# Patient Record
Sex: Male | Born: 1950 | Race: White | Hispanic: No | Marital: Single | Smoking: Current every day smoker
Health system: Southern US, Community
[De-identification: ages and names within clinical notes are randomized; demographics above are authoritative.]

## PROBLEM LIST (undated history)

## (undated) DIAGNOSIS — IMO0002 Reserved for concepts with insufficient information to code with codable children: Secondary | ICD-10-CM

## (undated) DIAGNOSIS — I10 Essential (primary) hypertension: Secondary | ICD-10-CM

## (undated) DIAGNOSIS — F1010 Alcohol abuse, uncomplicated: Secondary | ICD-10-CM

## (undated) DIAGNOSIS — H548 Legal blindness, as defined in USA: Secondary | ICD-10-CM

---

## 2009-08-12 ENCOUNTER — Emergency Department (HOSPITAL_COMMUNITY): Admission: EM | Admit: 2009-08-12 | Discharge: 2009-08-12 | Payer: Self-pay | Admitting: Emergency Medicine

## 2009-08-22 ENCOUNTER — Encounter: Payer: Self-pay | Admitting: Internal Medicine

## 2009-08-22 ENCOUNTER — Ambulatory Visit: Payer: Self-pay | Admitting: Internal Medicine

## 2009-08-22 ENCOUNTER — Inpatient Hospital Stay (HOSPITAL_COMMUNITY): Admission: EM | Admit: 2009-08-22 | Discharge: 2009-08-23 | Payer: Self-pay | Admitting: Emergency Medicine

## 2009-08-24 ENCOUNTER — Telehealth (INDEPENDENT_AMBULATORY_CARE_PROVIDER_SITE_OTHER): Payer: Self-pay | Admitting: *Deleted

## 2009-08-25 ENCOUNTER — Telehealth (INDEPENDENT_AMBULATORY_CARE_PROVIDER_SITE_OTHER): Payer: Self-pay

## 2009-08-29 ENCOUNTER — Telehealth (INDEPENDENT_AMBULATORY_CARE_PROVIDER_SITE_OTHER): Payer: Self-pay | Admitting: *Deleted

## 2009-09-06 ENCOUNTER — Telehealth (INDEPENDENT_AMBULATORY_CARE_PROVIDER_SITE_OTHER): Payer: Self-pay | Admitting: *Deleted

## 2009-09-07 ENCOUNTER — Telehealth (INDEPENDENT_AMBULATORY_CARE_PROVIDER_SITE_OTHER): Payer: Self-pay

## 2009-09-09 ENCOUNTER — Encounter (INDEPENDENT_AMBULATORY_CARE_PROVIDER_SITE_OTHER): Payer: Self-pay | Admitting: *Deleted

## 2010-05-30 NOTE — Consult Note (Signed)
Summary: Consultation Report  Consultation Report   Imported By: Debby Freiberg 10/26/2009 12:12:00  _____________________________________________________________________  External Attachment:    Type:   Image     Comment:   External Document

## 2010-05-30 NOTE — Progress Notes (Signed)
Summary: Nuclear Pre-procedure  Phone Note Outgoing Call Call back at Home Phone [phone]   Call placed by: Stanton Kidney, EMT-P,  Sep 06, 2009 2:35 PM Call placed to: Patient Action Taken: Phone Call Completed Summary of Call: Reviewed information on Myoview Information Sheet (see scanned document for further details).  Spoke with Patient.    Nuclear Med Background Indications for Stress Test: Evaluation for Ischemia, Post Hospital  Indications Comments: 08/22/09 [facility] - CP  History: Echo, Heart Catheterization, Myocardial Infarction, Stents  History Comments: 02/11[place]Heart [place] ([place]) 08/22/09 ECHO EF 55-60%  Symptoms: Chest Pain    Nuclear Pre-Procedure Cardiac Risk Factors: Family History - CAD, Hypertension, Smoker  Nuclear Med Study Referring MD:  [employer]

## 2010-05-30 NOTE — Progress Notes (Signed)
Summary: Nuclear Pre-Procedure  Phone Note Outgoing Call   Call placed by: Milana Na, EMT-P,  August 24, 2009 1:00 PM Summary of Call: Reviewed information on Myoview Information Sheet (see scanned document for further details).  Spoke with patient.      Nuclear Med Background Indications for Stress Test: Evaluation for Ischemia, Post Hospital  Indications Comments: 08/22/09 [facility] - CP  History: Echo, Heart Catheterization, Myocardial Infarction, Stents   Symptoms: Chest Pain    Nuclear Pre-Procedure Cardiac Risk Factors: Family History - CAD, Hypertension, Smoker  Nuclear Med Study Referring MD:  [employer]

## 2010-05-30 NOTE — Progress Notes (Signed)
Summary: Myoview Cancellation  Phone Note Outgoing Call Call back at Home Phone [phone]   Call placed by: Irean Hong, RN,  August 25, 2009 8:53 AM Summary of Call: The patient was a no show for myoview today due to transportation. Rescheduled the lexiscan myoview for 08/30/09 at 11:45. The patient told it would be a $100.00 charge if no show for this appointment. Louine Tenpenny,RN.

## 2010-05-30 NOTE — Progress Notes (Signed)
Summary: Nuclear Pre-Procedure  Phone Note Outgoing Call   Call placed by: Milana Na, EMT-P,  Aug 29, 2009 4:10 PM Summary of Call: Reviewed information on Myoview Information Sheet (see scanned document for further details).  No Answer.     Nuclear Med Background Indications for Stress Test: Evaluation for Ischemia, Post Hospital  Indications Comments: 08/22/09 [facility] - CP  History: Echo, Heart Catheterization, Myocardial Infarction, Stents  History Comments: 02/11[place] 08/22/09 ECHO EF 55-60%  Symptoms: Chest Pain    Nuclear Pre-Procedure Cardiac Risk Factors: Family History - CAD, Hypertension, Smoker  Nuclear Med Study Referring MD:  [employer]

## 2010-05-30 NOTE — Letter (Signed)
Summary: Appointment - Missed  [facility], Main Office  [address]   [place], [place] [address]   Phone: [phone]  Fax: [phone]     Sep 09, 2009 MRN: [id number]   [facility] [address] [place], [place]  [address]   Dear Mr. Rando,  Our records indicate you missed your appointment on 09/08/2009 with Dr. Tenny Craw. It is very important that we reach you to reschedule this appointment. We look forward to participating in your health care needs. Please contact us at the number listed above at your earliest convenience to reschedule this appointment.     Sincerely,   Migdalia Dk [facility] Scheduling Team

## 2010-05-30 NOTE — Progress Notes (Signed)
Summary: No Show for [facility]  Phone Note Outgoing Call Call back at [facility] Phone [phone]   Call placed by: Irean Hong, RN,  Sep 07, 2009 1:19 PM Summary of Call: The patient was a no show for myoview today, left message for the patient to call us back. This was the third no show for this test. The patient will need to have office visit prior to test. Dylan Jacobson.

## 2010-07-18 LAB — COMPREHENSIVE METABOLIC PANEL
ALT: 15 U/L (ref 0–53)
AST: 27 U/L (ref 0–37)
Albumin: 3.4 g/dL — ABNORMAL LOW (ref 3.5–5.2)
Alkaline Phosphatase: 49 U/L (ref 39–117)
BUN: 10 mg/dL (ref 6–23)
Calcium: 8.7 mg/dL (ref 8.4–10.5)
Chloride: 106 mEq/L (ref 96–112)
GFR calc non Af Amer: 59 mL/min — ABNORMAL LOW (ref >60)
Potassium: 4 mEq/L (ref 3.5–5.1)
Sodium: 138 mEq/L (ref 135–145)
Total Protein: 6.2 g/dL (ref 6.0–8.3)

## 2010-07-18 LAB — LIPID PANEL
Cholesterol: 171 mg/dL (ref 0–200)
LDL Cholesterol: 127 mg/dL — ABNORMAL HIGH (ref 0–99)
Total CHOL/HDL Ratio: 7.4 RATIO
Triglycerides: 107 mg/dL (ref <150)
VLDL: 21 mg/dL (ref 0–40)

## 2010-07-18 LAB — GLUCOSE, CAPILLARY: Glucose-Capillary: 88 mg/dL (ref 70–99)

## 2010-07-18 LAB — BASIC METABOLIC PANEL
GFR calc Af Amer: >60 mL/min (ref >60)
GFR calc non Af Amer: 59 mL/min — ABNORMAL LOW (ref >60)

## 2010-07-18 LAB — HEMOGLOBIN A1C
Hgb A1c MFr Bld: 5.4 % (ref <5.7)
Mean Plasma Glucose: 108 mg/dL (ref <117)

## 2010-07-18 LAB — CBC
HCT: 39.8 % (ref 39.0–52.0)
Hemoglobin: 13.3 g/dL (ref 13.0–17.0)
MCV: 92.2 fL (ref 78.0–100.0)
Platelets: 191 10*3/uL (ref 150–400)
RBC: 4.21 MIL/uL — ABNORMAL LOW (ref 4.22–5.81)
RDW: 13.4 % (ref 11.5–15.5)
WBC: 5.1 10*3/uL (ref 4.0–10.5)

## 2010-07-18 LAB — POCT CARDIAC MARKERS
CKMB, poc: 1.4 ng/mL (ref 1.0–8.0)
Troponin i, poc: <0.05 ng/mL (ref 0.00–0.09)

## 2010-07-18 LAB — POCT I-STAT, CHEM 8
Chloride: 103 mEq/L (ref 96–112)
Potassium: 4 mEq/L (ref 3.5–5.1)
Sodium: 141 mEq/L (ref 135–145)
TCO2: 28 mmol/L (ref 0–100)

## 2010-07-18 LAB — CARDIAC PANEL(CRET KIN+CKTOT+MB+TROPI): CK, MB: 1.9 ng/mL (ref 0.3–4.0)

## 2010-10-21 ENCOUNTER — Emergency Department: Payer: Self-pay | Admitting: Emergency Medicine

## 2011-01-20 IMAGING — CR DG CHEST 1V PORT
1 series · 1 of 1 positions shown · non-contrast
Comparison: 08/12/2009

CLINICAL DATA: Right chest pain

PORTABLE CHEST - 1 VIEW

[AP]
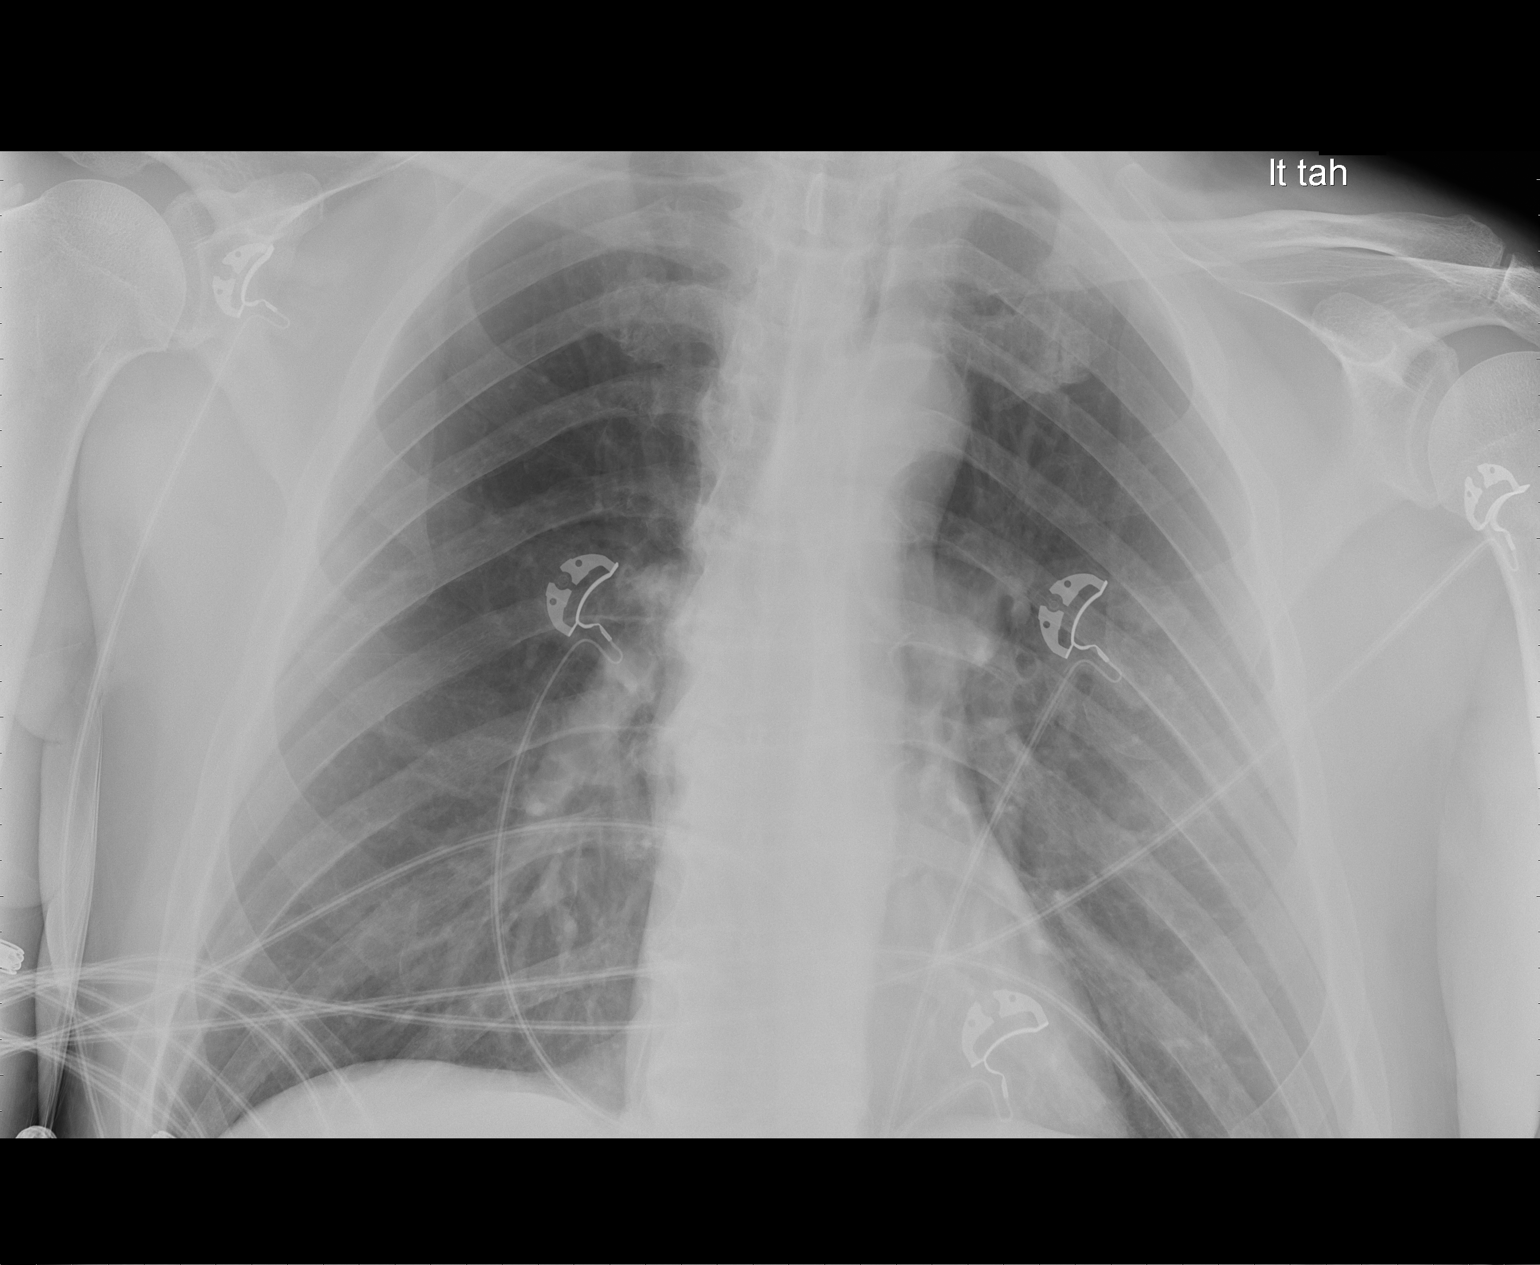

[1 of 1 positions shown; findings below may reference images not displayed]

FINDINGS: Heart and mediastinal contours normal.  Lungs clear.
Osseous structures intact in one-view.

Post cervical fusion.
IMPRESSION: No active disease in one-view.

## 2012-03-20 IMAGING — CR DG CHEST 1V PORT
1 series · 1 of 1 positions shown · non-contrast
Comparison: none

REASON FOR EXAM: cp
COMMENTS:

PROCEDURE:     DXR - DXR PORTABLE CHEST SINGLE VIEW  - October 21, 2010  [DATE]
RESULT:     No acute cardiopulmonary disease noted.

[view not recorded]
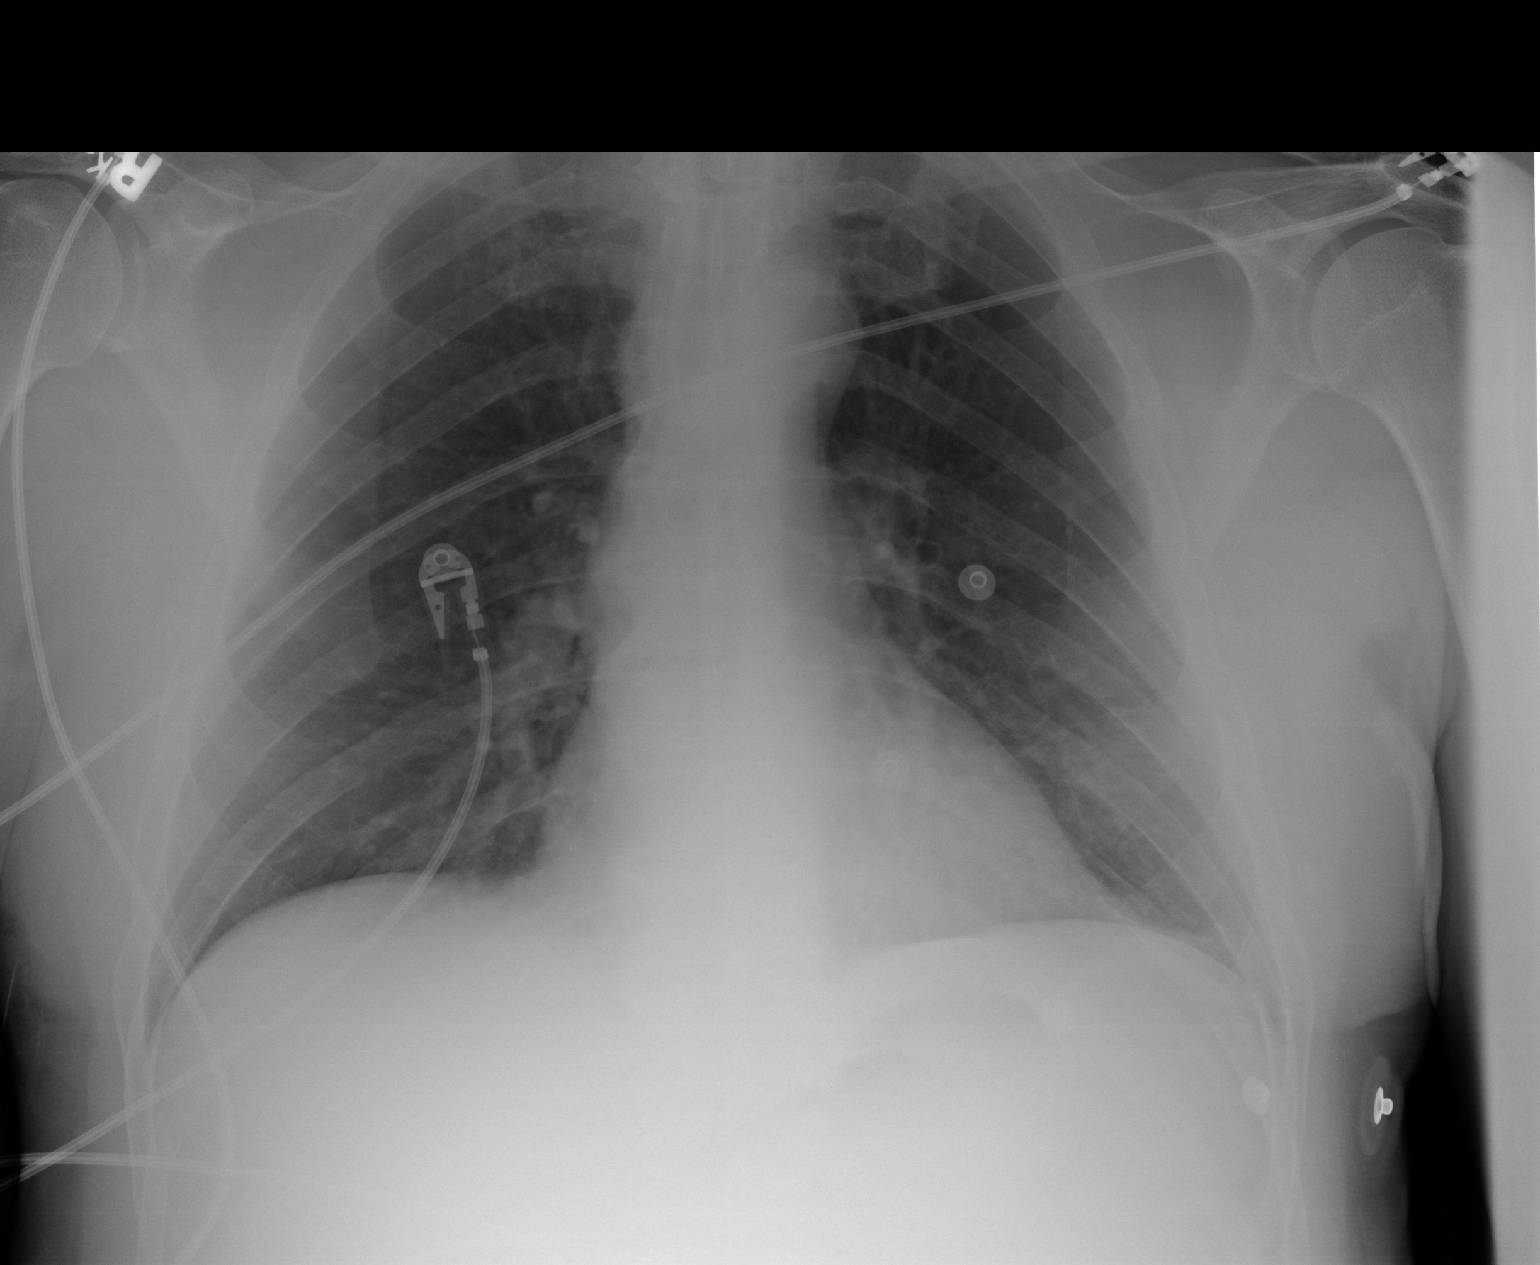

[1 of 1 positions shown; findings below may reference images not displayed]

IMPRESSION: No acute abnormality.

## 2012-03-20 IMAGING — CT CT HEAD WITHOUT CONTRAST
2 series · 16 of 30 positions shown, 20 images · non-contrast
Comparison: none

REASON FOR EXAM: vertigo
COMMENTS:

PROCEDURE:     CT  - CT HEAD WITHOUT CONTRAST  - October 21, 2010  [DATE]
RESULT:     History: Vertigo.
Comparison Study: No prior.

[Series 2: without · axial · non-contrast · 0.45mm/px · z∈[+170,+304]mm · 13 of 33 slices shown, 17 images]
[im 3/33  brain]
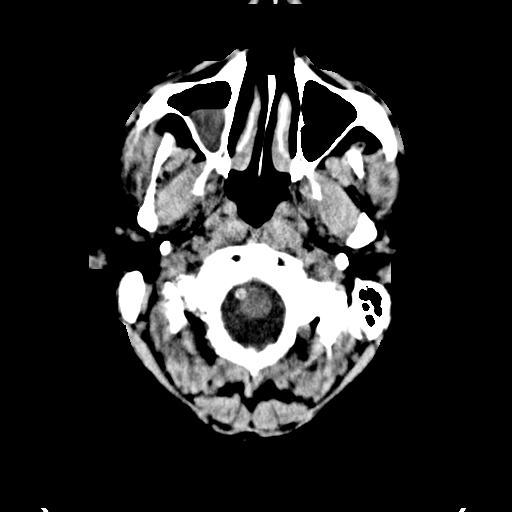
[im 3/33  bone]
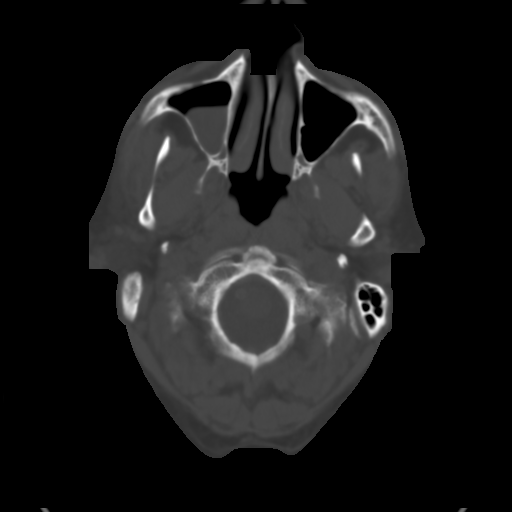
[im 5/33  brain]
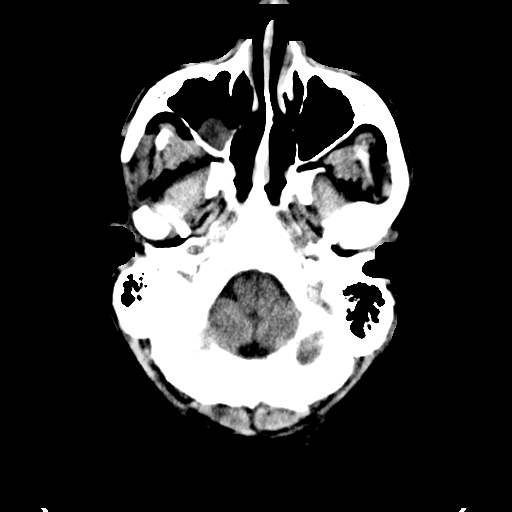
[im 7/33  brain]
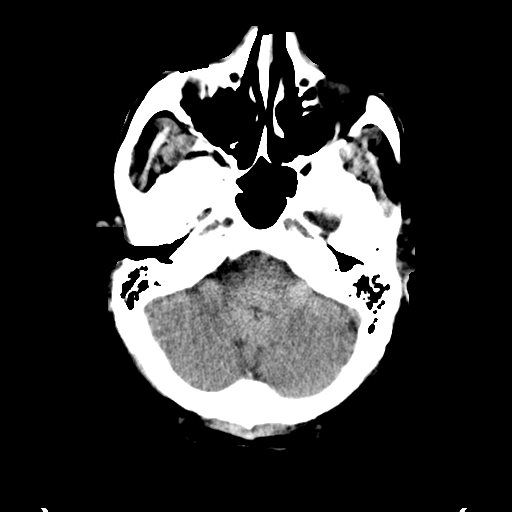
[im 10/33  brain]
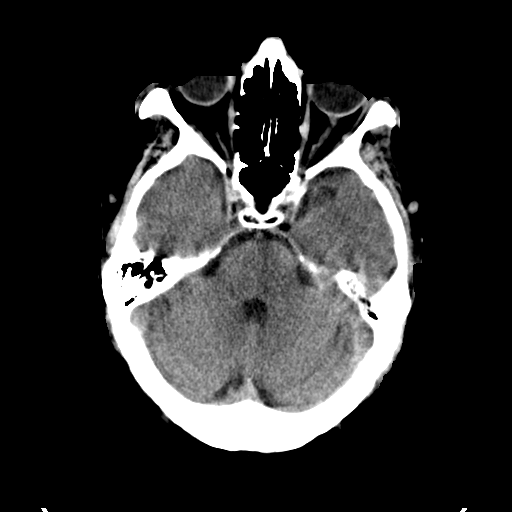
[im 12/33  brain]
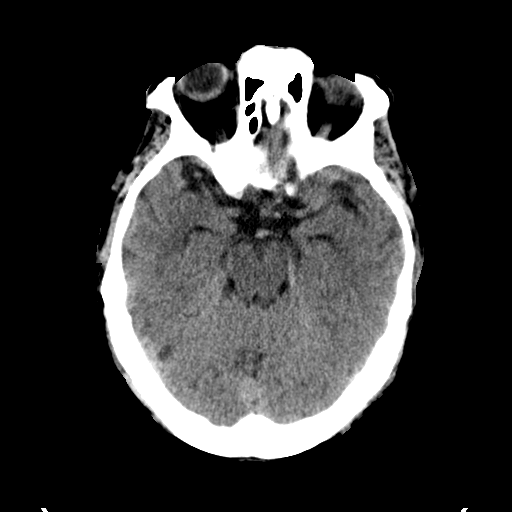
[im 12/33  bone]
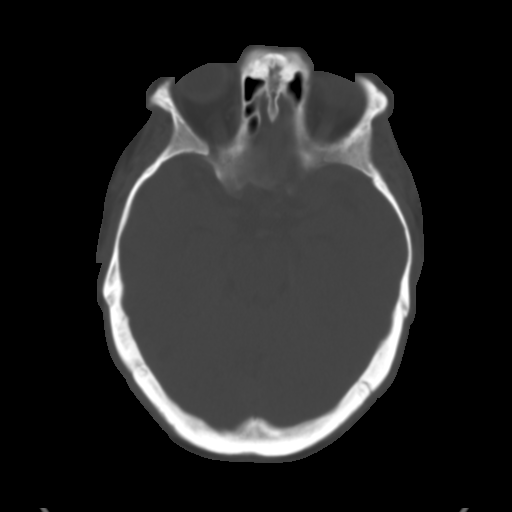
[im 14/33  brain]
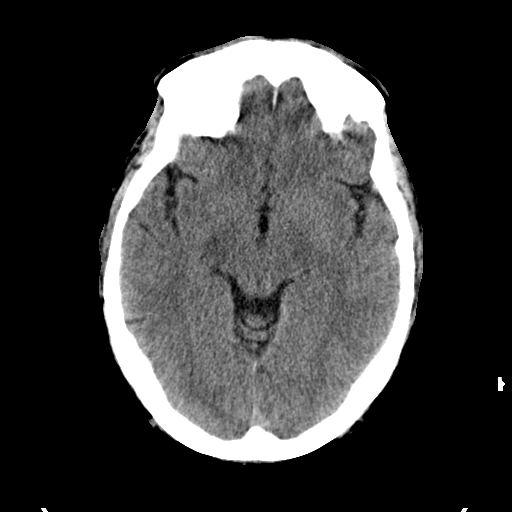
[im 17/33  brain]
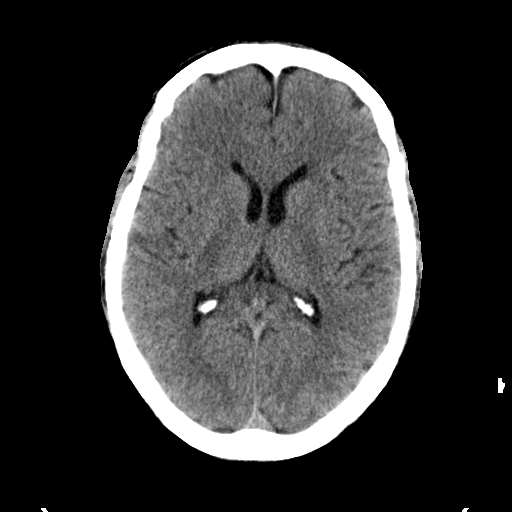
[im 19/33  brain]
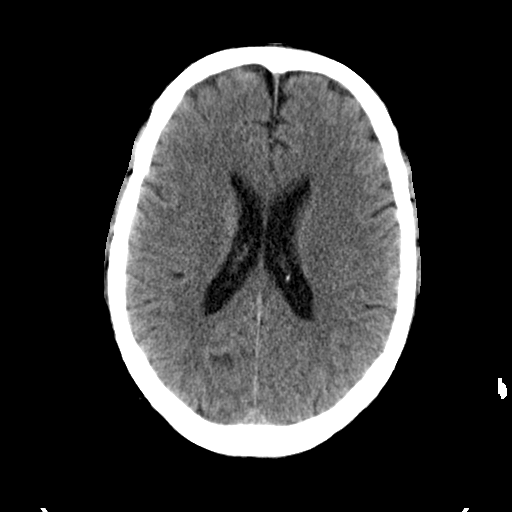
[im 21/33  brain]
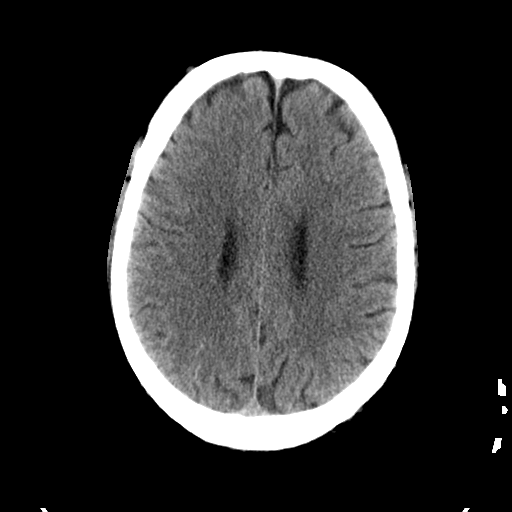
[im 21/33  bone]
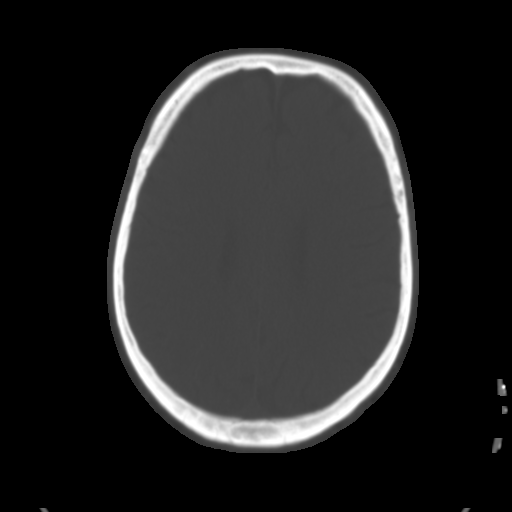
[im 23/33  brain]
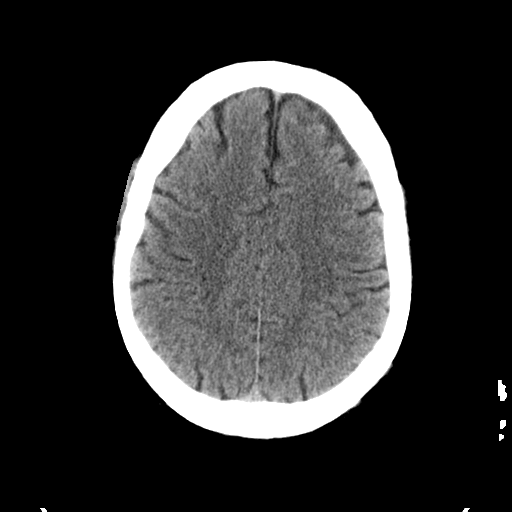
[im 26/33  brain]
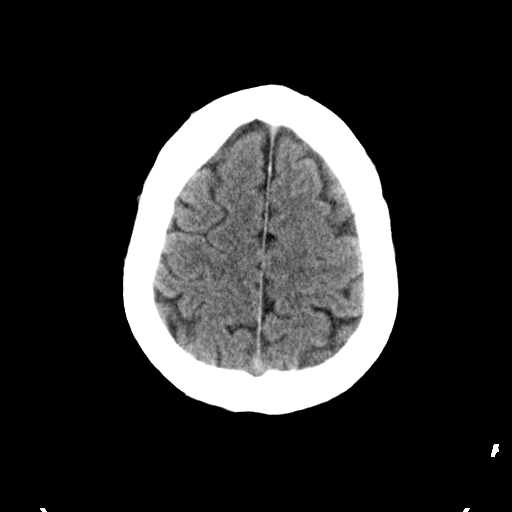
[im 28/33  brain]
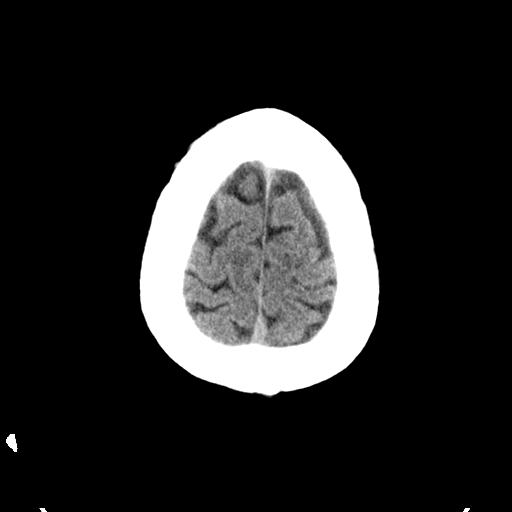
[im 30/33  brain]
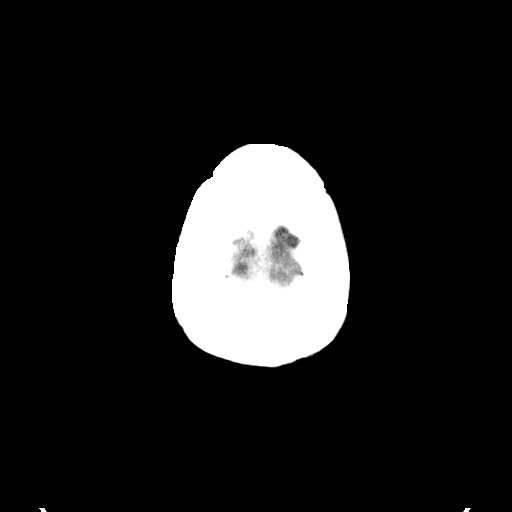
[im 30/33  bone]
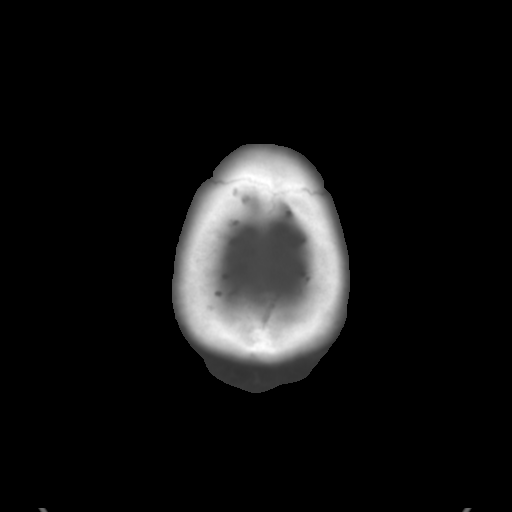

[Series 3: bone · axial · 0.45mm/px · z∈[+170,+214]mm · 3 of 33 slices shown]
[im 3/33  bone]
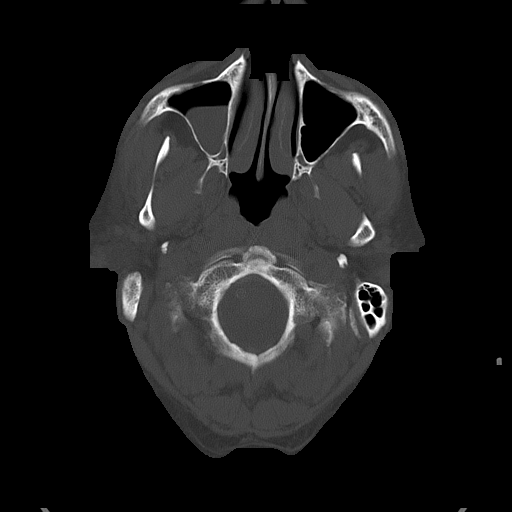
[im 7/33  bone]
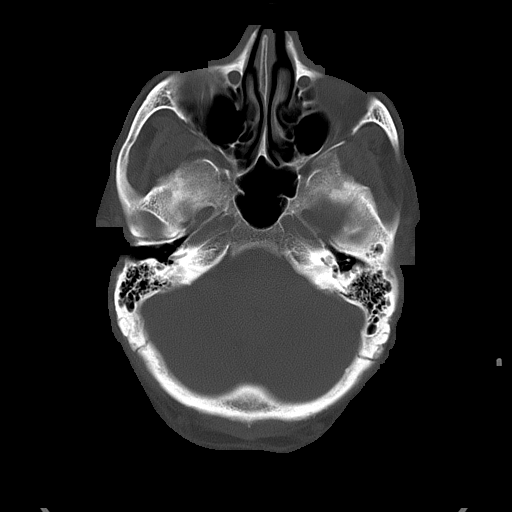
[im 12/33  bone]
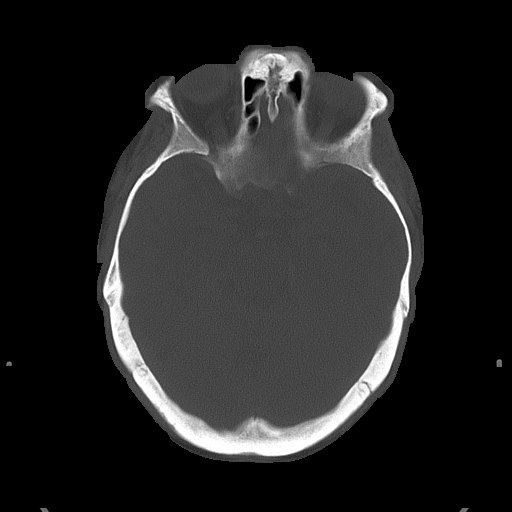

[16 of 30 positions shown; findings below may reference images not displayed]

FINDINGS: No mass. No hemorrhage. No hydrocephalus. Vascular calcification
present. No acute bony abnormality.
IMPRESSION: No acute abnormality.

## 2013-06-12 ENCOUNTER — Emergency Department (HOSPITAL_COMMUNITY)
Admission: EM | Admit: 2013-06-12 | Discharge: 2013-06-12 | Disposition: A | Discharge status: Other Acute Inpatient Hospital | Payer: Medicare Other | Source: Home / Self Care | Attending: Emergency Medicine | Admitting: Emergency Medicine

## 2013-06-12 ENCOUNTER — Encounter (HOSPITAL_COMMUNITY): Payer: Self-pay | Admitting: Emergency Medicine

## 2013-06-12 ENCOUNTER — Inpatient Hospital Stay (HOSPITAL_COMMUNITY)
Admission: AD | Admit: 2013-06-12 | Discharge: 2013-06-22 | DRG: 885 | Disposition: A | Discharge status: Home / Self Care | Payer: Medicare Other | Source: Intra-hospital | Attending: Psychiatry | Admitting: Psychiatry

## 2013-06-12 DIAGNOSIS — F32A Depression, unspecified: Secondary | ICD-10-CM

## 2013-06-12 DIAGNOSIS — H548 Legal blindness, as defined in USA: Secondary | ICD-10-CM | POA: Diagnosis present

## 2013-06-12 DIAGNOSIS — R45851 Suicidal ideations: Secondary | ICD-10-CM

## 2013-06-12 DIAGNOSIS — Z91199 Patient's noncompliance with other medical treatment and regimen due to unspecified reason: Secondary | ICD-10-CM | POA: Insufficient documentation

## 2013-06-12 DIAGNOSIS — F1010 Alcohol abuse, uncomplicated: Secondary | ICD-10-CM | POA: Insufficient documentation

## 2013-06-12 DIAGNOSIS — R630 Anorexia: Secondary | ICD-10-CM

## 2013-06-12 DIAGNOSIS — F329 Major depressive disorder, single episode, unspecified: Secondary | ICD-10-CM

## 2013-06-12 DIAGNOSIS — R6889 Other general symptoms and signs: Secondary | ICD-10-CM | POA: Insufficient documentation

## 2013-06-12 DIAGNOSIS — Z598 Other problems related to housing and economic circumstances: Secondary | ICD-10-CM

## 2013-06-12 DIAGNOSIS — R05 Cough: Secondary | ICD-10-CM

## 2013-06-12 DIAGNOSIS — I10 Essential (primary) hypertension: Secondary | ICD-10-CM | POA: Diagnosis present

## 2013-06-12 DIAGNOSIS — R059 Cough, unspecified: Secondary | ICD-10-CM | POA: Insufficient documentation

## 2013-06-12 DIAGNOSIS — F332 Major depressive disorder, recurrent severe without psychotic features: Principal | ICD-10-CM | POA: Diagnosis present

## 2013-06-12 DIAGNOSIS — F1020 Alcohol dependence, uncomplicated: Secondary | ICD-10-CM | POA: Diagnosis present

## 2013-06-12 DIAGNOSIS — F3289 Other specified depressive episodes: Secondary | ICD-10-CM | POA: Insufficient documentation

## 2013-06-12 DIAGNOSIS — Z9119 Patient's noncompliance with other medical treatment and regimen: Secondary | ICD-10-CM

## 2013-06-12 DIAGNOSIS — IMO0002 Reserved for concepts with insufficient information to code with codable children: Secondary | ICD-10-CM | POA: Diagnosis present

## 2013-06-12 DIAGNOSIS — Z5987 Material hardship due to limited financial resources, not elsewhere classified: Secondary | ICD-10-CM

## 2013-06-12 DIAGNOSIS — F17200 Nicotine dependence, unspecified, uncomplicated: Secondary | ICD-10-CM | POA: Insufficient documentation

## 2013-06-12 HISTORY — DX: Alcohol abuse, uncomplicated: F10.10

## 2013-06-12 HISTORY — DX: Legal blindness, as defined in USA: H54.8

## 2013-06-12 HISTORY — DX: Reserved for concepts with insufficient information to code with codable children: IMO0002

## 2013-06-12 HISTORY — DX: Essential (primary) hypertension: I10

## 2013-06-12 HISTORY — PX: BACK SURGERY: SHX140

## 2013-06-12 LAB — COMPREHENSIVE METABOLIC PANEL
ALT: 18 U/L (ref 0–53)
AST: 43 U/L — ABNORMAL HIGH (ref 0–37)
Albumin: 3.9 g/dL (ref 3.5–5.2)
Alkaline Phosphatase: 60 U/L (ref 39–117)
BILIRUBIN TOTAL: 0.4 mg/dL (ref 0.3–1.2)
BUN: 10 mg/dL (ref 6–23)
CALCIUM: 9.1 mg/dL (ref 8.4–10.5)
CO2: 26 mEq/L (ref 19–32)
CREATININE: 1.07 mg/dL (ref 0.50–1.35)
Chloride: 101 mEq/L (ref 96–112)
GFR calc Af Amer: 83 mL/min — ABNORMAL LOW (ref >90)
GFR, EST NON AFRICAN AMERICAN: 72 mL/min — AB (ref >90)
Glucose, Bld: 84 mg/dL (ref 70–99)
POTASSIUM: 3.8 meq/L (ref 3.7–5.3)
Sodium: 140 mEq/L (ref 137–147)
TOTAL PROTEIN: 7.3 g/dL (ref 6.0–8.3)

## 2013-06-12 LAB — CBC
HEMATOCRIT: 41.2 % (ref 39.0–52.0)
HEMOGLOBIN: 13.5 g/dL (ref 13.0–17.0)
MCH: 30.5 pg (ref 26.0–34.0)
MCHC: 32.8 g/dL (ref 30.0–36.0)
MCV: 93 fL (ref 78.0–100.0)
PLATELETS: 235 10*3/uL (ref 150–400)
RBC: 4.43 MIL/uL (ref 4.22–5.81)
RDW: 13.6 % (ref 11.5–15.5)
WBC: 7.7 10*3/uL (ref 4.0–10.5)

## 2013-06-12 LAB — ETHANOL

## 2013-06-12 MED ORDER — ACETAMINOPHEN 325 MG PO TABS
650.0000 mg | ORAL_TABLET | Freq: Four times a day (QID) | ORAL | Status: DC | PRN
  Administered 2013-06-17: 650 mg via ORAL
  Filled 2013-06-12: qty 2

## 2013-06-12 MED ORDER — ALUM & MAG HYDROXIDE-SIMETH 200-200-20 MG/5ML PO SUSP
30.0000 mL | ORAL | Status: DC | PRN
Start: 2013-06-12 — End: 2013-06-22

## 2013-06-12 MED ORDER — IBUPROFEN 200 MG PO TABS: 600.0000 mg | ORAL_TABLET | Freq: Three times a day (TID) | ORAL | Status: DC | PRN

## 2013-06-12 MED ORDER — ZOLPIDEM TARTRATE 5 MG PO TABS: 5.0000 mg | ORAL_TABLET | Freq: Every evening | ORAL | Status: DC | PRN

## 2013-06-12 MED ORDER — ONDANSETRON HCL 4 MG PO TABS: 4.0000 mg | ORAL_TABLET | Freq: Three times a day (TID) | ORAL | Status: DC | PRN

## 2013-06-12 MED ORDER — ALUM & MAG HYDROXIDE-SIMETH 200-200-20 MG/5ML PO SUSP: 30.0000 mL | ORAL | Status: DC | PRN

## 2013-06-12 MED ORDER — MAGNESIUM HYDROXIDE 400 MG/5ML PO SUSP
30.0000 mL | Freq: Every day | ORAL | Status: DC | PRN
Start: 2013-06-12 — End: 2013-06-22

## 2013-06-12 MED ORDER — CARVEDILOL 12.5 MG PO TABS
12.5000 mg | ORAL_TABLET | Freq: Two times a day (BID) | ORAL | Status: DC
  Filled 2013-06-12 (×3): qty 1

## 2013-06-12 MED ORDER — ACETAMINOPHEN 325 MG PO TABS: 650.0000 mg | ORAL_TABLET | ORAL | Status: DC | PRN

## 2013-06-12 MED ORDER — TRAZODONE HCL 50 MG PO TABS: 50.0000 mg | ORAL_TABLET | Freq: Every evening | ORAL | Status: DC | PRN

## 2013-06-12 NOTE — ED Notes (Signed)
House coverage and charge RN aware of need for sitter 

## 2013-06-12 NOTE — ED Provider Notes (Addendum)
CSN: [id number]     Arrival date & time 06/12/13  1447 History   First MD Initiated Contact with Patient 06/12/13 1507     Chief Complaint  Patient presents with  . Medical Clearance     (Consider location/radiation/quality/duration/timing/severity/associated sxs/prior Treatment) Patient is a 63 y.o. male presenting with mental health disorder. The history is provided by the patient.  Mental Health Problem Presenting symptoms: depression and suicidal thoughts   Patient accompanied by: no one. Degree of incapacity (severity):  Severe Onset quality:  Gradual Duration:  1 month Timing:  Constant Progression:  Worsening Chronicity:  Recurrent Context: alcohol use and noncompliance   Context: not drug abuse and not recent medication change   Context comment:  Has not been taking his seroquel Treatment compliance:  Some of the time Time since last psychoactive medication taken:  1 month Relieved by:  None tried Worsened by:  Alcohol Ineffective treatments:  None tried Associated symptoms: anhedonia, appetite change and feelings of worthlessness   Associated symptoms: no abdominal pain   Risk factors: hx of suicide attempts and recent psychiatric admission   Risk factors: no neurological disease   Risk factors comment:  Last admitted about 1 year ago   Past Medical History  Diagnosis Date  . Hypertension   . Alcohol abuse    History reviewed. No pertinent past surgical history. History reviewed. No pertinent family history. History  Substance Use Topics  . Smoking status: Current Every Day Smoker  . Smokeless tobacco: Not on file  . Alcohol Use: Not on file    Review of Systems  Constitutional: Positive for appetite change.  Respiratory: Positive for cough. Negative for shortness of breath.   Gastrointestinal: Negative for abdominal pain.  Psychiatric/Behavioral: Positive for suicidal ideas.  All other systems reviewed and are negative.      Allergies  Review of  patient's allergies indicates no known allergies.  Home Medications  No current outpatient prescriptions on file. BP 159/92  Pulse 93  Temp(Src) 98.6 F (37 C)  Resp 18  SpO2 97% Physical Exam  Nursing note and vitals reviewed. Constitutional: He is oriented to person, place, and time. He appears well-developed and well-nourished. No distress.  HENT:  Head: Normocephalic and atraumatic.  Mouth/Throat: Oropharynx is clear and moist.  Eyes: Conjunctivae and EOM are normal. Pupils are equal, round, and reactive to light.  Neck: Normal range of motion. Neck supple.  Cardiovascular: Normal rate, regular rhythm and intact distal pulses.   No murmur heard. Pulmonary/Chest: Effort normal and breath sounds normal. No respiratory distress. He has no wheezes. He has no rales.  Abdominal: Soft. He exhibits no distension. There is no tenderness. There is no rebound and no guarding.  Musculoskeletal: Normal range of motion. He exhibits no edema and no tenderness.  Neurological: He is alert and oriented to person, place, and time.  Skin: Skin is warm and dry. No rash noted. No erythema.  Psychiatric: His speech is normal and behavior is normal. His affect is blunt. He exhibits a depressed mood. He expresses suicidal ideation. He expresses suicidal plans.    ED Course  Procedures (including critical care time) Labs Review Labs Reviewed  COMPREHENSIVE METABOLIC PANEL - Abnormal; Notable for the following:    AST 43 (*)    GFR calc non Af Amer 72 (*)    GFR calc Af Amer 83 (*)    All other components within normal limits  CBC  ETHANOL  URINE RAPID DRUG SCREEN (HOSP PERFORMED)  Imaging Review No results found.  EKG Interpretation   None       MDM   Final diagnoses:  Alcohol abuse  Depression  Suicidal ideation    Patient is here for depression and alcohol abuse. He had suicidal ideations today about overdosing on his Seroquel but spoke with his priest who recommended he come  here. Patient has prior history of suicidal ideation and depression most recently about one year ago with hospitalization elsewhere. Patient has not had an alcoholic beverage in over 2 days with no obvious signs of withdrawal.  Patient has a normal exam except for depression, flat affect and suicidal ideation. Patient does have a history of hypertension and states he has not taken his medication in about one month. However he is unclear what medication he takes regularly.  We'll speak with TTS and psych holding orders done and labs pending.  5:50 PM Labs wnl.  Medically clear.  TTS will see.   Gwyneth Sprout, MD 06/12/13 1750  Gwyneth Sprout, MD 06/12/13 1751

## 2013-06-12 NOTE — ED Notes (Signed)
Spoke with pt regarding plan [facility] [place] [facility] Pt is in agreement.

## 2013-06-12 NOTE — BH Assessment (Signed)
Tele Assessment Note   Dylan Jacobson is a 63 y.o. widowed white male.  He presents at [facility] unaccompanied, reportedly at the recommendation of a Catholic priest to whom he spoke earlier today.  He problems with alcohol, along with SI.  Stressors: Pt reports that about 1 month ago he relapsed on alcohol.  He lives with a friend that he has known for about 1 year, and the alcohol abuse reportedly contributed to conflict between the two of them.  Pt is uncertain whether or not he is welcome back into the household.  He has no other social supports, being alienated from his brother due to his drinking problems.  Pt's mother died in a nursing home about 1 year ago, and pt feels guilty that he did not take care of her himself.  Lethality: Suicidality:  Pt states, "I almost took my pills today."  He refers to prescription Seroquel that he has not been taking, instead hoarding it in anticipation of a suicide attempt.  He reports that earlier today he looked for a place where he could overdose and remain undisturbed.  He states, "I don't like myself and I don't want to go on."   He attributes his decision to come to the ED to the intervention of the priest and pt's concern about afterlife.  However, pt reports that he has overdosed on 3 occasions in the past, twice by overdose, and most recently by a combination of overdosing and cutting his arms bilaterally, deeply enough to require sutures.  This attempt was 8 months ago.  He denies any other history of self mutilation.  Pt endorses depressed mood with symptoms noted in the "risk to self" assessment below.  He also reports that he has not eaten in 4 - 5 days, and has lost about 20# in the past month.   Homicidality: Pt denies homicidal thoughts or physical aggression.  Pt reports that his brother has firearms, however, given that the brother does not want him around due to his drinking, the weapons are not accessible to the pt..  Pt denies having any legal  problems at this time.  Pt is calm and cooperative during assessment. Psychosis: Pt denies hallucinations, although he reports that he has been talking to himself a great deal of late.  Pt does not appear to be responding to internal stimuli and exhibits no delusional thought.  Pt's reality testing appears to be intact. Substance Abuse: Pt reports that he first started using alcohol at 63 y/o.  He relapsed about 1 month ago, and was drinking about 18 beers daily before stopping 2 days ago.  He denies any history of seizures, DT's or blackouts, but reports some withdrawal symptoms as noted in the "Additional Social History" section below, with a CIWA score of 10.  He denies abusing any other substances.  He attributes his longest period of sobriety, and 8 year period between 1985 and 1993, to getting married, but his wife perished about 14 years ago.  Alcohol abuse has contributed to pt's alienation from people that are dear to him and to financial problems, and in the past he faced a public intoxication charge.  Social Supports: Pt reports that he has no social supports at this time, being estranged from his brother, even though he cites the brother as his emergency contact.  He may be homeless at this time.  He continues to grieve the death of his mother about 1 year ago.  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  He identifies as Air traffic controller.  Treatment History: Pt reports that he has been admitted to 28 day programs on three occasions, one in [place], and two in [place], [place], including his most recent admission about 8 months ago.  He reports that he has never entered into any outpatient treatment of any kind, other than AA meetings.  His prescription Seroquel is from the last 28 day program.  Today pt is willing to volunteer for admission to [facility] if it is believed to be in his interest.  Afterwards, he would like to enter a  halfway house, since he has never tried this before as a means of maintaining sobriety.   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 Hypertension   . Alcohol abuse   . Degenerative disc disease 06/12/2013  . 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   Axis IV: housing problems, problems with primary support group and problems related to grieving Axis V: GAF = 30  Past Medical History:  Past Medical History  Diagnosis Date  . Hypertension   . Alcohol abuse   . Degenerative disc disease 06/12/2013  . 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    Past Surgical History  Procedure Laterality Date  . Back surgery  06/12/2013    Pt reports Hx of 3 back surgeries    Family History: History reviewed. No pertinent family history.  Social History:  reports that he has been smoking Cigarettes.  He has been smoking about 0.50 packs per day. He has never used smokeless tobacco. He reports that he drinks alcohol. He reports that he does not use illicit drugs.  Additional Social History:  Alcohol / Drug Use Pain Medications: Denies Prescriptions: Denies Over the Counter: Denies History of alcohol / drug use?: Yes Longest period of sobriety (when/how long): 8 years from 1985 - 1993, due to marriage Negative Consequences of Use: Personal relationships;Legal;Financial Withdrawal Symptoms: Tingling;Tremors;Fever / Chills (Denies Hx of seizures, DT's, blackouts) Substance #1 Name of Substance 1: Alcohol 1 - Age of First Use: 63 y/o 1 - Amount (size/oz): 18 beers 1 - Frequency: daily 1 - Duration: 1 month 1 - Last Use / Amount: 2 days ago, per pattern  CIWA: CIWA-Ar BP: 137/81 mmHg Pulse Rate: 83 Nausea and Vomiting: no nausea and no vomiting Tactile  Disturbances: mild itching, pins and needles, burning or numbness Tremor: not visible, but can be felt fingertip to fingertip Auditory Disturbances: not present Paroxysmal Sweats: no sweat visible Visual Disturbances: mild sensitivity Anxiety: mildly anxious Headache, Fullness in Head: very mild Agitation: normal activity Orientation and Clouding of Sensorium: disoriented for date by more than 2 calendar days CIWA-Ar Total: 10 COWS:    Allergies: No Known Allergies  Home Medications:  (Not in a hospital admission)  OB/GYN Status:  No LMP for male patient.  General Assessment Data Location of Assessment: [facility] ED Is this a Tele or Face-to-Face Assessment?: Tele Assessment Is this an Initial Assessment or a Re-assessment for this encounter?: Initial Assessment Living Arrangements: Non-relatives/Friends Can pt return to current living arrangement?:  (Uncertain) Admission Status: Voluntary Is patient capable of signing voluntary admission?: Yes Transfer from: Acute Hospital Referral Source: Other ([facility])  Medical Screening Exam [facility] Walk-in ONLY) Medical Exam  completed: No Reason for MSE not completed: Other: (Medically cleared @ [facility])  [facility] Living Arrangements: Non-relatives/Friends Name of Psychiatrist: None Name of Therapist: None  Education Status Is patient currently in school?: No Highest grade of school patient has completed: High school graduate Contact person: Kellie MoorLarry Thompson (brother) [phone]  Risk to self Suicidal Ideation: Yes-Currently Present Suicidal Intent: Yes-Currently Present Is patient at risk for suicide?: Yes Suicidal Plan?: Yes-Currently Present Specify Current Suicidal Plan: Pt hoarded Seroquel with intent to OD today. Access to Means: Yes Specify Access to Suicidal Means: Seroquel What has been your use of drugs/alcohol within the last 12 months?: Relapse on ETOH 1 month ago. Previous Attempts/Gestures: Yes How many times?: 3  (2 by OD, 1 by OD & bilateral cutting w/ sutures 8 mos. ago.) Other Self Harm Risks: "I don't like myself & I don't want to go on." Premeditated overdose today.  No social supports. Triggers for Past Attempts: Other (Comment) (Relapse on alcohol, death of mother 1 year ago.) Intentional Self Injurious Behavior: Cutting Comment - Self Injurious Behavior: Only with suicidal intent. Family Suicide History: No Recent stressful life event(s): Conflict (Comment);Other (Comment) (Unstable living environment; conflict with host/roommate.) Persecutory voices/beliefs?: No Depression: Yes Depression Symptoms: Despondent;Insomnia;Tearfulness;Isolating;Fatigue;Guilt;Loss of interest in usual pleasures;Feeling worthless/self pity;Feeling angry/irritable (Hopelessness) Substance abuse history and/or treatment for substance abuse?: Yes (Relapse on alcohol 1 month ago.) Suicide prevention information given to non-admitted patients: Not applicable (Tele-assessment: unable to provide)  Risk to Others Homicidal Ideation: No Thoughts of Harm to Others: No Current Homicidal Intent: No Current Homicidal Plan: No Access to Homicidal Means: No Identified Victim: None History of harm to others?: No Assessment of Violence: None Noted Violent Behavior Description: Calm/cooperative Does patient have access to weapons?: No (Brother has guns, but does not want pt around.) Criminal Charges Pending?: No Does patient have a court date: No  Psychosis Hallucinations: None noted (...but reports talking to himself) Delusions: None noted  Mental Status Report Appear/Hygiene: Other (Comment);Disheveled (Paper scrubs) Eye Contact: Good Motor Activity: Unremarkable Speech: Other (Comment) (Unremarkable) Level of Consciousness: Alert Mood: Depressed;Anxious Affect: Anxious;Depressed Anxiety Level: Panic Attacks (Currently minimal) Panic attack frequency: 1 - 2 times a day Most recent panic attack: 2 days ago Thought  Processes: Coherent;Relevant Judgement: Unimpaired Orientation: Person;Place;Time;Situation (Time: except for date (06/09/2013)) Obsessive Compulsive Thoughts/Behaviors: Moderate (Organizing; threw away $300 watch (off by 5 minutes))  Cognitive Functioning Concentration: Decreased Memory: Recent Intact;Remote Intact IQ: Average Insight: Good Impulse Control: Fair Horticulturist, 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.) Appetite: Poor (None for 4 - 5 days prior to today.) Weight Loss: 20 (...or more over past month) Weight Gain: 0 Sleep: Decreased Total Hours of Sleep: 4 (4 - 5 hrs/night x 1 month; worsening) Vegetative Symptoms: Staying in bed;Not bathing;Decreased grooming  ADLScreening [facility] Assessment Services) Patient's cognitive ability adequate to safely complete daily activities?: Yes Patient able to express need for assistance with ADLs?: Yes Independently performs ADLs?: Yes (appropriate for developmental age)  Prior Inpatient Therapy Prior Inpatient Therapy: Yes Prior Therapy Dates: 8 months ago: Most recent of two [place] programs [place] Prior Therapy Facilty/Provider(s): Past: 28-day program in [place] Reason for Treatment: All for detox/rehab  Prior Outpatient Therapy Prior Outpatient Therapy: No Prior Therapy Dates: No professional outpatient treatment history Prior Therapy Facilty/Provider(s): Hx of participation in AA meetings about 2 years ago. Reason for Treatment: Rehab  ADL Screening (condition at time of admission) Patient's cognitive ability adequate to safely complete daily activities?: Yes Is the patient deaf  or have difficulty hearing?: No Does the patient have difficulty seeing, even when wearing glasses/contacts?: No Does the patient have difficulty concentrating, remembering, or making decisions?: No Patient able to express need for assistance with ADLs?: Yes Does the patient have difficulty dressing or bathing?: No Independently performs ADLs?: Yes  (appropriate for developmental age) Does the patient have difficulty walking or climbing stairs?: No Weakness of Legs: None Weakness of Arms/Hands: None  Home Assistive Devices/Equipment Home Assistive Devices/Equipment: None    Abuse/Neglect Assessment (Assessment to be complete while patient is alone) Physical Abuse: Denies Verbal Abuse: Denies Sexual Abuse: Denies Exploitation of patient/patient's resources: Denies Self-Neglect: Denies Values / Beliefs Cultural Requests During Hospitalization: Other (comment) (Identiifies as Catholic)   Merchant navy officer (For Healthcare) Advance Directive: Patient does not have advance directive (Tele-assessment: unable to provide packet) Pre-existing out of facility DNR order (yellow form or pink MOST form): No Nutrition Screen- [facility] Adult/[facility]/AP Patient's home diet: Regular  Additional Information 1:1 In Past 12 Months?: No CIRT Risk: No Elopement Risk: No Does patient have medical clearance?: Yes     Disposition:  Disposition Initial Assessment Completed for this Encounter: Yes Disposition of Patient: Inpatient treatment program Type of inpatient treatment program: Adult After consulting with Claudette Head, NP @ 18:48, it has been determined that pt presents a life threatening danger to himself, for which psychiatric hospitalization is indicated.  Pt accepted to [facility] [address] to the service of Leata Mouse, MD, Rm [phone].  At 18:58 I spoke to EDP Gwyneth Sprout, MD, who concurs with this decision.  At 18:59 I spoke to pt's nurse, Thayer Ohm, who agrees to have pt sign Voluntary Admission and Consent for Treatment.  Doylene Canning, MA Triage Specialist Raphael Gibney 06/12/2013 7:40 PM

## 2013-06-12 NOTE — ED Notes (Signed)
Spoke with pt regarding

## 2013-06-12 NOTE — ED Notes (Signed)
Pt in c/o SI over the last few days, history of same, also states he is an alcoholic and his last drink was a few days ago, patient states when he drinks he consumes around 18 beers a day, denies HI, no distress noted, pt calm and cooperative.

## 2013-06-12 NOTE — ED Notes (Signed)
Hand off report called to Nehemiah SettleBrooke, Charity fundraiserN at [facility].

## 2013-06-12 NOTE — BH Assessment (Signed)
[facility] Assessment Progress Note  At 17:46 I spoke to EDP Gwyneth SproutWhitney Plunkett, MD in anticipation of TTS assessment scheduled for 18:00.  Doylene Canninghomas Cruise Baumgardner, MA Triage Specialist 06/12/2013 @ 17:50

## 2013-06-12 NOTE — ED Notes (Signed)
[facility] with acknowledgement of having all belongings with transport.

## 2013-06-13 ENCOUNTER — Encounter (HOSPITAL_COMMUNITY): Payer: Self-pay | Admitting: *Deleted

## 2013-06-13 DIAGNOSIS — F1020 Alcohol dependence, uncomplicated: Secondary | ICD-10-CM | POA: Diagnosis present

## 2013-06-13 MED ORDER — ADULT MULTIVITAMIN W/MINERALS CH
1.0000 | ORAL_TABLET | Freq: Every day | ORAL | Status: DC
  Administered 2013-06-13 – 2013-06-22 (×10): 1 via ORAL
  Filled 2013-06-13 (×12): qty 1

## 2013-06-13 MED ORDER — THIAMINE HCL 100 MG/ML IJ SOLN: 100.0000 mg | Freq: Once | INTRAMUSCULAR | Status: DC

## 2013-06-13 MED ORDER — CHLORDIAZEPOXIDE HCL 25 MG PO CAPS
25.0000 mg | ORAL_CAPSULE | Freq: Once | ORAL | Status: DC
  Filled 2013-06-13: qty 1

## 2013-06-13 MED ORDER — CHLORDIAZEPOXIDE HCL 25 MG PO CAPS
25.0000 mg | ORAL_CAPSULE | Freq: Four times a day (QID) | ORAL | Status: AC
  Administered 2013-06-13 (×2): 25 mg via ORAL
  Filled 2013-06-13 (×2): qty 1

## 2013-06-13 MED ORDER — CHLORDIAZEPOXIDE HCL 25 MG PO CAPS
25.0000 mg | ORAL_CAPSULE | Freq: Three times a day (TID) | ORAL | Status: AC
  Administered 2013-06-14: 25 mg via ORAL

## 2013-06-13 MED ORDER — LISINOPRIL 10 MG PO TABS: 10.0000 mg | ORAL_TABLET | Freq: Every day | ORAL | Status: DC

## 2013-06-13 MED ORDER — QUETIAPINE FUMARATE ER 50 MG PO TB24
100.0000 mg | ORAL_TABLET | Freq: Every day | ORAL | Status: DC
  Administered 2013-06-13 – 2013-06-22 (×10): 100 mg via ORAL
  Filled 2013-06-13 (×10): qty 2
  Filled 2013-06-13: qty 10
  Filled 2013-06-13: qty 2

## 2013-06-13 MED ORDER — CARVEDILOL 12.5 MG PO TABS
12.5000 mg | ORAL_TABLET | Freq: Two times a day (BID) | ORAL | Status: DC
  Administered 2013-06-13 – 2013-06-22 (×17): 12.5 mg via ORAL
  Filled 2013-06-13: qty 10
  Filled 2013-06-13 (×12): qty 1
  Filled 2013-06-13: qty 10
  Filled 2013-06-13 (×8): qty 1

## 2013-06-13 MED ORDER — VITAMIN B-1 100 MG PO TABS
100.0000 mg | ORAL_TABLET | Freq: Every day | ORAL | Status: DC
Start: 2013-06-14 — End: 2013-06-22
  Administered 2013-06-13 – 2013-06-22 (×10): 100 mg via ORAL
  Filled 2013-06-13 (×12): qty 1

## 2013-06-13 MED ORDER — CHLORDIAZEPOXIDE HCL 25 MG PO CAPS: 25.0000 mg | ORAL_CAPSULE | Freq: Four times a day (QID) | ORAL | Status: AC | PRN

## 2013-06-13 MED ORDER — CHLORDIAZEPOXIDE HCL 25 MG PO CAPS
25.0000 mg | ORAL_CAPSULE | ORAL | Status: AC
  Administered 2013-06-15 (×2): 25 mg via ORAL
  Filled 2013-06-13 (×2): qty 1

## 2013-06-13 MED ORDER — CHLORDIAZEPOXIDE HCL 25 MG PO CAPS
25.0000 mg | ORAL_CAPSULE | Freq: Every day | ORAL | Status: AC
  Administered 2013-06-16: 25 mg via ORAL
  Filled 2013-06-13: qty 1

## 2013-06-13 MED ORDER — ONDANSETRON 4 MG PO TBDP: 4.0000 mg | ORAL_TABLET | Freq: Four times a day (QID) | ORAL | Status: AC | PRN

## 2013-06-13 MED ORDER — LOPERAMIDE HCL 2 MG PO CAPS: 2.0000 mg | ORAL_CAPSULE | ORAL | Status: AC | PRN

## 2013-06-13 MED ORDER — QUETIAPINE FUMARATE ER 300 MG PO TB24
300.0000 mg | ORAL_TABLET | Freq: Every day | ORAL | Status: DC
  Administered 2013-06-14 – 2013-06-21 (×6): 300 mg via ORAL
  Filled 2013-06-13 (×6): qty 1
  Filled 2013-06-13: qty 5
  Filled 2013-06-13 (×4): qty 1

## 2013-06-13 MED ORDER — HYDROXYZINE HCL 25 MG PO TABS: 25.0000 mg | ORAL_TABLET | Freq: Four times a day (QID) | ORAL | Status: AC | PRN

## 2013-06-13 NOTE — Tx Team (Signed)
Initial Interdisciplinary Treatment Plan  PATIENT STRENGTHS: (choose at least two) Ability for insight Average or above average intelligence General fund of knowledge Motivation for treatment/growth  PATIENT STRESSORS: Medication change or noncompliance Substance abuse   PROBLEM LIST: Problem List/Patient Goals Date to be addressed Date deferred Reason deferred Estimated date of resolution  Depression 06/12/13     Suicidal thoughts 06/12/13     ETOH abuse 06/12/13                                          DISCHARGE CRITERIA:  Ability to meet basic life and health needs Improved stabilization in mood, thinking, and/or behavior Verbal commitment to aftercare and medication compliance  PRELIMINARY DISCHARGE PLAN: Attend aftercare/continuing care group  PATIENT/FAMIILY INVOLVEMENT: This treatment plan has been presented to and reviewed with the patient, Dylan Jacobson, and/or family member, .  The patient and family have been given the opportunity to ask questions and make suggestions.  Dylan Jacobson, Dylan Jacobson 06/13/2013, 12:25 AM

## 2013-06-13 NOTE — Progress Notes (Signed)
  D: Pt observed sleeping in bed with eyes closed. RR even and unlabored. No distress noted  .  A: Q 15 minute checks were done for safety.  R: safety maintained on unit.  

## 2013-06-13 NOTE — Progress Notes (Signed)
63 year old male pt admitted on voluntary basis. Pt reports that he lives with a friend and that he has been depressed and suicidal and also has been using alcohol more frequently. He reports that he ":binge drinks" and consumes 18 beers when he does this and stated that he will do that for a few days and then nothing at all for a few months but he reports the binging is becoming more frequent. Pt reports that he has only been living in the area for a short while and that he is from [place] and reports that he is thinking about going back there at some point in time. Pt reports that his family members live in the [place] area. Pt also reports that he has a PCP in [place] and also that he has not been taking medications as prescribed. Pt reports that he wants help with his depression. Pt does endorse some passive SI on admission but able to contract for safety on the unit. Pt was oriented to the unit and safety maintained.

## 2013-06-13 NOTE — [facility] Group Notes (Signed)
[facility] Group Notes:  (Clinical Social Work)  06/13/2013   1:15-2:15PM  Summary of Progress/Problems:   The main focus of today's process group was for the patient to identify ways in which they have sabotaged their own mental health wellness/recovery.  Motivational interviewing and a handout were used to explore the benefits and costs of their self-sabotaging behavior as well as the benefits and costs of changing this behavior.  The Stages of Change were explained to the group using a handout, and patients identified where they are with regard to changing self-defeating behaviors.  The patient expressed he self-sabotages with isolating himself from people when they start to care about him, before they care too much or can get close.  Type of Therapy:  Process Group  Participation Level:  Active  Participation Quality:  Attentive and Supportive  Affect:  Blunted and Depressed  Cognitive:  Oriented  Insight:  Engaged  Engagement in Therapy:  Engaged  Modes of Intervention:  Education, Motivational Interviewing   Ambrose MantleMareida Grossman-Orr, LCSW 06/13/2013, 4:00pm

## 2013-06-13 NOTE — B[facility] Suicide Risk Assessment (Signed)
   Nursing information obtained from:    Demographic factors:    Current Mental Status:    Loss Factors:    Historical Factors:    Risk Reduction Factors:    Total Time spent with patient: 20 minutes  CLINICAL FACTORS:   Severe Anxiety and/or Agitation Depression:   Anhedonia Comorbid alcohol abuse/dependence Hopelessness Impulsivity Insomnia Alcohol/Substance Abuse/Dependencies Unstable or Poor Therapeutic Relationship  Psychiatric Specialty Exam: Physical Exam  Psychiatric: His speech is normal and behavior is normal. His mood appears anxious. Cognition and memory are normal. He expresses impulsivity. He exhibits a depressed mood. He expresses suicidal ideation.    Review of Systems  Constitutional: Negative.   HENT: Negative.   Eyes: Negative.   Respiratory: Negative.   Cardiovascular: Negative.   Gastrointestinal: Negative.   Genitourinary: Negative.   Musculoskeletal: Negative.   Skin: Negative.   Neurological: Negative.   Endo/Heme/Allergies: Negative.   Psychiatric/Behavioral: Positive for depression, suicidal ideas and substance abuse. The patient is nervous/anxious and has insomnia.     Blood pressure 138/73, pulse 92, temperature 98.4 F (36.9 C), temperature source Oral, resp. rate 18, height 5\' 7"  (1.702 m), weight 76.204 kg (168 lb).Body mass index is 26.31 kg/(m^2).  General Appearance: Disheveled  Eye SolicitorContact::  Fair  Speech:  Clear and Coherent  Volume:  Decreased  Mood:  Anxious and Depressed  Affect:  Constricted  Thought Process:  Goal Directed  Orientation:  Full (Time, Place, and Person)  Thought Content:  Negative  Suicidal Thoughts:  Yes.  without intent/plan  Homicidal Thoughts:  No  Memory:  Immediate;   Fair Recent;   Fair Remote;   Fair  Judgement:  Poor  Insight:  Lacking  Psychomotor Activity:  Decreased  Concentration:  Fair  Recall:  FiservFair  Fund of Knowledge:Fair  Language: Fair  Akathisia:  No  Handed:  Right  AIMS (if  indicated):     Assets:  Communication Skills Desire for Improvement  Sleep:  Number of Hours: 6.25   Musculoskeletal: Strength & Muscle Tone: within normal limits Gait & Station: normal Patient leans: N/A  COGNITIVE FEATURES THAT CONTRIBUTE TO RISK:  Closed-mindedness    SUICIDE RISK:   Mild:  Suicidal ideation of limited frequency, intensity, duration, and specificity.  There are no identifiable plans, no associated intent, mild dysphoria and related symptoms, good self-control (both objective and subjective assessment), few other risk factors, and identifiable protective factors, including available and accessible social support.  PLAN OF CARE:1. Admit for crisis management and stabilization. 2. Medication management to reduce current symptoms to base line and improve the  patient's overall level of functioning 3. Treat health problems as indicated. 4. Develop treatment plan to decrease risk of relapse upon discharge and the need for readmission. 5. Psycho-social education regarding relapse prevention and self care. 6. Health care follow up as needed for medical problems. 7. Restart home medications where appropriate. 8. Initiate mood stabilizer.   I certify that inpatient services furnished can reasonably be expected to improve the patient's condition.  Thedore MinsAkintayo, Evans Levee, MD 06/13/2013, 10:21 AM

## 2013-06-13 NOTE — H&P (Signed)
Psychiatric Admission Assessment Adult  Patient Identification:  Dylan Jacobson Date of Evaluation:  06/13/2013 Chief Complaint:  MDD History of Present Illness:  63 y.o. widowed white male. He presents at [facility] unaccompanied, reportedly at the recommendation of a Catholic priest to whom he spoke earlier today. He problems with alcohol, along with SI. Stressors:  Pt reports that about 1 month ago he relapsed on alcohol. He lives with a friend that he has known for about 1 year, and the alcohol abuse reportedly contributed to conflict between the two of them. Pt is uncertain whether or not he is welcome back into the household. He has no other social supports, being alienated from his brother due to his drinking problems. Pt's mother died in a nursing home about 1 year ago, and pt feels guilty that he did not take care of her himself. Suicidality: Pt states, "I almost took my pills today." He refers to prescription Seroquel that he has not been taking, instead hoarding it in anticipation of a suicide attempt. He reports that earlier today he looked for a place where he could overdose and remain undisturbed. He states, "I don't like myself and I don't want to go on." He attributes his decision to come to the ED to the intervention of the priest and pt's concern about afterlife. However, pt reports that he has overdosed on 3 occasions in the past, twice by overdose, and most recently by a combination of overdosing and cutting his arms bilaterally, deeply enough to require sutures. This attempt was 8 months ago. He denies any other history of self mutilation. Pt endorses depressed mood with symptoms noted in the "risk to self" assessment below. He also reports that he has not eaten in 4 - 5 days, and has lost about 20# in the past month. Homicidality: Pt denies homicidal thoughts or physical aggression. Pt reports that his brother has firearms, however, given that the brother does not want him around due to his  drinking, the weapons are not accessible to the pt.. Pt denies having any legal problems at this time. Pt is calm and cooperative during assessment.  Psychosis: Pt denies hallucinations, although he reports that he has been talking to himself a great deal of late. Pt does not appear to be responding to internal stimuli and exhibits no delusional thought. Pt's reality testing appears to be intact. Substance Abuse: Pt reports that he first started using alcohol at 63 y/o. He relapsed about 1 month ago, and was drinking about 18 beers daily before stopping 2 days ago. He denies any history of seizures, DT's or blackouts, but reports some withdrawal symptoms as noted in the "Additional Social History" section below, with a CIWA score of 10. He denies abusing any other substances. He attributes his longest period of sobriety, and 8 year period between [place], to getting married, but his wife perished about 14 years ago. Alcohol abuse has contributed to pt's alienation from people that are dear to him and to financial problems, and in the past he faced a public intoxication charge. Pt reports that he has no social supports at this time, being estranged from his brother, even though he cites the brother as his emergency contact. He may be homeless at this time. He continues to grieve the death of his mother about 1 year ago. 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 He identifies as Nurse, learning disability. Pt reports that he has been admitted  to 28 day programs on three occasions, one in [place], and two in [place], [place], including his most recent admission about 8 months ago. He reports that he has never entered into any outpatient treatment of any kind, other than AA meetings. His prescription Seroquel is from the last 28 day program. Today pt is willing to volunteer for admission to [facility] if it is believed to be in his interest. Afterwards, he would  like to enter a halfway house, since he has never tried this before as a means of maintaining sobriety.   Elements:  Location:  generalized. Quality:  acute. Severity:  severe. Timing:  constant. Duration:  one month. Context:  psychosocial stressors--financial, relationship issues, living arrangements. Associated Signs/Synptoms: Depression Symptoms:  depressed mood, hopelessness, recurrent thoughts of death, suicidal thoughts with specific plan, anxiety, disturbed sleep, (Hypo) Manic Symptoms:  None Anxiety Symptoms:  Excessive Worry, Psychotic Symptoms:  None PTSD Symptoms:  None Total Time spent with patient: 30 minutes  Psychiatric Specialty Exam: Physical Exam  Constitutional: He is oriented to person, place, and time. He appears well-developed and well-nourished.  HENT:  Head: Normocephalic and atraumatic.  Neck: Normal range of motion.  Respiratory: Effort normal.  GI: Soft.  Musculoskeletal: Normal range of motion.  Neurological: He is alert and oriented to person, place, and time.  Skin: Skin is warm and dry.    Review of Systems  Constitutional: Negative.   HENT: Negative.   Eyes: Negative.   Respiratory: Negative.   Cardiovascular: Negative.   Gastrointestinal: Negative.   Genitourinary: Negative.   Musculoskeletal: Negative.   Skin: Negative.   Neurological: Negative.   Endo/Heme/Allergies: Negative.   Psychiatric/Behavioral: Positive for depression, suicidal ideas and substance abuse. The patient is nervous/anxious.     Blood pressure 138/73, pulse 92, temperature 98.4 F (36.9 C), temperature source Oral, resp. rate 18, height 5' 7"  (1.702 m), weight 76.204 kg (168 lb).Body mass index is 26.31 kg/(m^2).  General Appearance: Casual  Eye Contact::  Fair  Speech:  Normal Rate  Volume:  Decreased  Mood:  Depressed  Affect:  Congruent  Thought Process:  Coherent  Orientation:  Full (Time, Place, and Person)  Thought Content:  WDL  Suicidal Thoughts:   Yes.  with intent/plan  Homicidal Thoughts:  No  Memory:  Immediate;   Fair Recent;   Fair Remote;   Fair  Judgement:  Poor  Insight:  Fair  Psychomotor Activity:  Decreased  Concentration:  Fair  Recall:  [employer] of Knowledge:Fair  Language: Fair  Akathisia:  No  Handed:  Right  AIMS (if indicated):     Assets:  Resilience  Sleep:  Number of Hours: 6.25    Musculoskeletal: Strength & Muscle Tone: within normal limits Gait & Station: normal Patient leans: N/A  Past Psychiatric History: Diagnosis:  Depression, Alcohol dependency  Hospitalizations:  Multiple in [place], [place], [place]  Outpatient Care:  None at present  Substance Abuse Care:  Multiple ones, last one in [place]--4-5 months ago for 21 days  Self-Mutilation:  None  Suicidal Attempts:  Overdose--last one was 2 years ago  Violent Behaviors:  None   Past Medical History:   Past Medical History  Diagnosis Date  . Hypertension   . Alcohol abuse   . Degenerative disc disease 06/12/2013  . 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   None. Allergies:  No Known Allergies PTA Medications: Prescriptions prior to admission  Medication Sig Dispense Refill  . QUEtiapine (  SEROQUEL) 100 MG tablet Take 100 mg by mouth every morning.      Marland Kitchen QUEtiapine (SEROQUEL) 300 MG tablet Take 300 mg by mouth at bedtime.        Previous Psychotropic Medications:  Medication/Dose   Seroquel   Substance Abuse History in the last 12 months:  yes  Consequences of Substance Abuse: Withdrawal Symptoms:   Tremors  Social History:  reports that he has been smoking Cigarettes.  He has been smoking about 0.50 packs per day. He has never used smokeless tobacco. He reports that he drinks alcohol. He reports that he does not use illicit drugs. Additional Social History:     Current Place of Residence:   Place of Birth:   Family Members: Marital Status:   Widowed Children:  Sons:  Daughters: Relationships: Education:  [employer] Problems/Performance: Religious Beliefs/Practices: History of Abuse (Emotional/Phsycial/Sexual) Ship broker History:  Dispensing optician History: Hobbies/Interests:  Family History:  History reviewed. No pertinent family history.  Results for orders placed during the Jacobson encounter of 06/12/13 (from the past 72 hour(s))  CBC     Status: None   Collection Time    06/12/13  4:24 PM      Result Value Ref Range   WBC 7.7  4.0 - 10.5 K/uL   RBC 4.43  4.22 - 5.81 MIL/uL   Hemoglobin 13.5  13.0 - 17.0 g/dL   HCT 41.2  39.0 - 52.0 %   MCV 93.0  78.0 - 100.0 fL   MCH 30.5  26.0 - 34.0 pg   MCHC 32.8  30.0 - 36.0 g/dL   RDW 13.6  11.5 - 15.5 %   Platelets 235  150 - 400 K/uL  COMPREHENSIVE METABOLIC PANEL     Status: Abnormal   Collection Time    06/12/13  4:24 PM      Result Value Ref Range   Sodium 140  137 - 147 mEq/L   Potassium 3.8  3.7 - 5.3 mEq/L   Chloride 101  96 - 112 mEq/L   CO2 26  19 - 32 mEq/L   Glucose, Bld 84  70 - 99 mg/dL   BUN 10  6 - 23 mg/dL   Creatinine, Ser 1.07  0.50 - 1.35 mg/dL   Calcium 9.1  8.4 - 10.5 mg/dL   Total Protein 7.3  6.0 - 8.3 g/dL   Albumin 3.9  3.5 - 5.2 g/dL   AST 43 (*) 0 - 37 U/L   ALT 18  0 - 53 U/L   Alkaline Phosphatase 60  39 - 117 U/L   Total Bilirubin 0.4  0.3 - 1.2 mg/dL   GFR calc non Af Amer 72 (*) >90 mL/min   GFR calc Af Amer 83 (*) >90 mL/min   Comment: (NOTE)     The eGFR has been calculated using the CKD EPI equation.     This calculation has not been validated in all clinical situations.     eGFR's persistently <90 mL/min signify possible Chronic Kidney     Disease.  ETHANOL     Status: None   Collection Time    06/12/13  4:24 PM      Result Value Ref Range   Alcohol, Ethyl (B) <11  0 - 11 mg/dL   Comment:            LOWEST DETECTABLE LIMIT FOR     SERUM ALCOHOL IS 11 mg/dL     FOR MEDICAL PURPOSES  ONLY   Psychological Evaluations:  Assessment:   DSM5:  Substance/Addictive Disorders:  Alcohol Related Disorder - Severe (303.90), Alcohol Intoxication with Use Disorder - Severe (F10.229) and Alcohol Withdrawal (291.81) Depressive Disorders:  Major Depressive Disorder - Severe (296.23)  AXIS I:  Alcohol Abuse, Anxiety Disorder NOS and Major Depression, Recurrent severe AXIS II:  Deferred AXIS III:   Past Medical History  Diagnosis Date  . Hypertension   . Alcohol abuse   . Degenerative disc disease 06/12/2013  . 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   AXIS IV:  economic problems, housing problems, other psychosocial or environmental problems, problems related to social environment and problems with primary support group AXIS V:  41-50 serious symptoms  Treatment Plan/Recommendations:  Plan:  Review of chart, vital signs, medications, and notes. 1-Admit for crisis management and stabilization.  Estimated length of stay 5-7 days past his current stay of 1 2-Individual and group therapy encouraged 3-Medication management for depression, alcohol withdrawal/detox and anxiety to reduce current symptoms to base line and improve the patient's overall level of functioning:  Medications reviewed with the patient and he stated no untoward effects, home medications in place and Librium protocol started 4-Coping skills for depression, substance abuse, and anxiety developing-- 5-Continue crisis stabilization and management 6-Address health issues--monitoring vital signs, stable  7-Treatment plan in progress to prevent relapse of depression, substance abuse, and anxiety 8-Psychosocial education regarding relapse prevention and self-care 8-Health care follow up as needed for any health concerns  9-Call for consult with hospitalist for additional specialty patient services as needed.  Treatment Plan Summary: Daily contact with patient to assess and evaluate  symptoms and progress in treatment Medication management Current Medications:  Current Facility-Administered Medications  Medication Dose Route Frequency Provider Last Rate Last Dose  . acetaminophen (TYLENOL) tablet 650 mg  650 mg Oral Q6H PRN Lurena Nida, NP      . alum & mag hydroxide-simeth (MAALOX/MYLANTA) 200-200-20 MG/5ML suspension 30 mL  30 mL Oral Q4H PRN Lurena Nida, NP      . carvedilol (COREG) tablet 12.5 mg  12.5 mg Oral BID WC Waylan Boga, NP      . chlordiazePOXIDE (LIBRIUM) capsule 25 mg  25 mg Oral Q6H PRN Waylan Boga, NP      . chlordiazePOXIDE (LIBRIUM) capsule 25 mg  25 mg Oral Once Waylan Boga, NP      . chlordiazePOXIDE (LIBRIUM) capsule 25 mg  25 mg Oral QID Waylan Boga, NP       Followed by  . [START ON 06/14/2013] chlordiazePOXIDE (LIBRIUM) capsule 25 mg  25 mg Oral TID Waylan Boga, NP       Followed by  . [START ON 06/15/2013] chlordiazePOXIDE (LIBRIUM) capsule 25 mg  25 mg Oral BH-qamhs Waylan Boga, NP       Followed by  . [START ON 06/16/2013] chlordiazePOXIDE (LIBRIUM) capsule 25 mg  25 mg Oral Daily Waylan Boga, NP      . hydrOXYzine (ATARAX/VISTARIL) tablet 25 mg  25 mg Oral Q6H PRN Waylan Boga, NP      . loperamide (IMODIUM) capsule 2-4 mg  2-4 mg Oral PRN Waylan Boga, NP      . magnesium hydroxide (MILK OF MAGNESIA) suspension 30 mL  30 mL Oral Daily PRN Lurena Nida, NP      . multivitamin with minerals tablet 1 tablet  1 tablet Oral Daily Waylan Boga, NP      . ondansetron (ZOFRAN-ODT)  disintegrating tablet 4 mg  4 mg Oral Q6H PRN Waylan Boga, NP      . QUEtiapine (SEROQUEL XR) 24 hr tablet 100 mg  100 mg Oral Daily Waylan Boga, NP      . QUEtiapine (SEROQUEL XR) 24 hr tablet 300 mg  300 mg Oral QHS Waylan Boga, NP      . thiamine (B-1) injection 100 mg  100 mg Intramuscular Once Waylan Boga, NP      . Derrill Memo ON 06/14/2013] thiamine (VITAMIN B-1) tablet 100 mg  100 mg Oral Daily Waylan Boga, NP        Observation Level/Precautions:  15  minute checks  Laboratory:  completed, reviewed, stable  Psychotherapy:  Individual and group therapy  Medications:  Librium alcohol detox protocol, Seroquel 100 mg in am and 300 mg at bedtime  Consultations:  None  Discharge Concerns:  Housing--would like a half-way house    Estimated LOS:  5-7 days  Other:     I certify that inpatient services furnished can reasonably be expected to improve the patient's condition.   Waylan Boga, PMH-NP 2/14/201510:07 AM  Patient seen, evaluated and I agree with notes by Nurse Practitioner. Corena Pilgrim, MD

## 2013-06-13 NOTE — Progress Notes (Signed)
Dylan Jacobson is seen OOB uAL on the 500 hall...he is getting more and more comfortable on the unit, as the day goes on.... HE  Takes his medications as ordered and he attends his groups, but does not engage in the conversation. He does not complete his self inventory, either.    A HE is encouraged to drink / force fluids .          r Safety is in place and poc cont.

## 2013-06-13 NOTE — Progress Notes (Signed)
Adult Psychoeducational Group Note  Date:  06/13/2013 Time:  1:17 AM  Group Topic/Focus:  Wrap-Up Group:   The focus of this group is to help patients review their daily goal of treatment and discuss progress on daily workbooks.  Participation Level:  Active  Participation Quality:  Appropriate  Affect:  Appropriate  Cognitive:  Appropriate  Insight: Appropriate  Engagement in Group:  Engaged  Modes of Intervention:  Discussion  Additional Comments:  Pt attended wrap-up group this evening and participated in group with peers.   Kendan Cornforth A 06/13/2013, 1:17 AM

## 2013-06-13 NOTE — Progress Notes (Signed)
[facility] Group Notes:  (Nursing/MHT/Case Management/Adjunct)  Date:  06/13/2013  Time:  11:37 PM  Type of Therapy:  Group Therapy  Participation Level:  Minimal  Participation Quality:  Appropriate  Affect:  Appropriate  Cognitive:  Appropriate  Insight:  Appropriate  Engagement in Group:  Engaged  Modes of Intervention:  Socialization and Support  Summary of Progress/Problems: Pt. Stated he had  "good day," because he spoke with a clinical social worker about placement after discharge. Pt. Was unsure of any use of coping skills and stated he would like to develop some.  Sondra ComeWilson, Shant Hence J 06/13/2013, 11:37 PM
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
## 2013-06-14 MED ORDER — ENSURE COMPLETE PO LIQD
237.0000 mL | Freq: Two times a day (BID) | ORAL | Status: DC
  Administered 2013-06-15 – 2013-06-22 (×14): 237 mL via ORAL

## 2013-06-14 NOTE — Progress Notes (Signed)
Adult Psychoeducational Group Note  Date:  06/14/2013 Time:  8:00 pm  Group Topic/Focus:  Wrap-Up Group:   The focus of this group is to help patients review their daily goal of treatment and discuss progress on daily workbooks.  Participation Level:  Active  Participation Quality:  Appropriate and Sharing  Affect:  Appropriate  Cognitive:  Appropriate  Insight: Appropriate  Engagement in Group:  Engaged  Modes of Intervention:  Discussion, Education, Socialization and Support  Additional Comments:  Pt stated that he hoping to get long term treatment for his depression and alcoholism once he transitions out of the hospital. Pt stated that he is a people person when asked to identify a positive personality trait.   Laural BenesJohnson, Cici Rodriges 06/14/2013, 10:29 PM

## 2013-06-14 NOTE — Progress Notes (Signed)
NUTRITION ASSESSMENT  Pt identified as at risk on the Malnutrition Screen Tool  INTERVENTION: 1. Educated patient on the importance of nutrition and encouraged intake of food and beverages.  Provided handout on sobriety and nutrition 2. Discussed weight goals. 3. Supplements: MVI and thiamine daily, Ensure Complete po BID, each supplement provides 350 kcal and 13 grams of protein   NUTRITION DIAGNOSIS: Unintentional weight loss related to sub-optimal intake as evidenced by pt report.   Goal: Pt to meet >/= 90% of their estimated nutrition needs.  Monitor:  PO intake  Assessment:  Patient admitted with SI and etoh abuse.  Patient reports that he is eating OK here but was not eating well prior to admit secondary to etoh abuse.  Patient reported not eating for the past 4-5 days prior to admit and weight loss of 20 lbs in the past month.  Patient reports UBW of 184 lbs 2-2 1/2 months ago.  Patient with a 9% weight loss in the past 2 months.    Patient meets criteria for moderate malnutrition related to social/environmental causes AEB weight loss of 9% in the past 2 months and intake <75% for the past 3 months.  63 y.o. male  Height: Ht Readings from Last 1 Encounters:  06/12/13 5\' 7"  (1.702 m)    Weight: Wt Readings from Last 1 Encounters:  06/12/13 168 lb (76.204 kg)    Weight Hx: Wt Readings from Last 10 Encounters:  06/12/13 168 lb (76.204 kg)    BMI:  Body mass index is 26.31 kg/(m^2). Pt meets criteria for overweight based on current BMI.  Estimated Nutritional Needs: Kcal: 25-30 kcal/kg Protein: > 1 gram protein/kg Fluid: 1 ml/kcal  Diet Order: General Pt is also offered choice of unit snacks mid-morning and mid-afternoon.  Pt is eating as desired.   Lab results and medications reviewed.   Oran ReinLaura Lounell Schumacher, RD, LDN Clinical Inpatient Dietitian Pager:  [phone] Weekend and after hours pager:  [phone]

## 2013-06-14 NOTE — Progress Notes (Signed)
[facility] MD Progress Note  06/14/2013 3:21 PM Dylan Jacobson  MRN:  [id number] Subjective: "I am still feeling suicidal and depressed.'' Objective: Patient with long history of depression and comorbid alcohol dependence. Patient reports his inability to stop drinking which has caused him a lot of anxiety and depression. He reports feeling lonely, stressed out, hopeless, worthless and has poor impulse control and difficulty sleeping. He is requesting for help to get a long term solution to his alcohol and mental problem. He verbalized suicidal thoughts but denies homicidal thoughts. He thinks his current medication regimen is working great and did not report any adverse reactions.  Diagnosis:   DSM5: Schizophrenia Disorders:   Obsessive-Compulsive Disorders:   Trauma-Stressor Disorders:   Substance/Addictive Disorders:  Alcohol Related Disorder - Severe (303.90) Depressive Disorders:  Major Depressive Disorder - Severe (296.23) Total Time spent with patient: 20 minutes  Axis I: Major depressive disorder- recurrent episode severe  Alcohol dependence  Axis II: Deferred Axis III:  Past Medical History  Diagnosis Date  . Hypertension   . Degenerative disc disease 06/12/2013  . 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   Axis IV: other psychosocial or environmental problems and problems related to social environment  ADL's:  Intact  Sleep: Fair  Appetite:  Fair  Suicidal Ideation: yes   Plan:  overdosing on pills Intent:  denies Means:  denies Homicidal Ideation: denies  AEB (as evidenced by):  Psychiatric Specialty Exam: Physical Exam  Psychiatric: His speech is normal. His mood appears anxious. He is slowed and withdrawn. Cognition and memory are normal. He expresses impulsivity. He exhibits a depressed mood. He expresses suicidal ideation.    Review of Systems  Constitutional: Negative.   HENT: Negative.   Eyes: Negative.   Respiratory: Negative.    Cardiovascular: Negative.   Gastrointestinal: Negative.   Genitourinary: Negative.   Musculoskeletal: Negative.   Skin: Negative.   Neurological: Positive for tremors.  Endo/Heme/Allergies: Negative.   Psychiatric/Behavioral: Positive for depression, suicidal ideas and substance abuse. The patient is nervous/anxious and has insomnia.     Blood pressure 118/77, pulse 87, temperature 98.7 F (37.1 C), temperature source Oral, resp. rate 17, height 5' 7"  (1.702 m), weight 76.204 kg (168 lb).Body mass index is 26.31 kg/(m^2).  General Appearance: Disheveled and poor grooming  Eye Contact::  Good  Speech:  Clear and Coherent  Volume:  Decreased  Mood:  Anxious, Depressed and Hopeless  Affect:  Constricted  Thought Process:  Goal Directed  Orientation:  Full (Time, Place, and Person)  Thought Content:  Negative  Suicidal Thoughts:  Yes.  without intent/plan  Homicidal Thoughts:  No  Memory:  Immediate;   Fair Recent;   Fair Remote;   Fair  Judgement:  Fair  Insight:  Shallow  Psychomotor Activity:  Decreased  Concentration:  Fair  Recall:  [employer] of Knowledge:Fair  Language: Good  Akathisia:  No  Handed:  Right  AIMS (if indicated):     Assets:  Communication Skills Desire for Improvement  Sleep:  Number of Hours: 6.75   Musculoskeletal: Strength & Muscle Tone: within normal limits Gait & Station: normal Patient leans: N/A  Current Medications: Current Facility-Administered Medications  Medication Dose Route Frequency Provider Last Rate Last Dose  . acetaminophen (TYLENOL) tablet 650 mg  650 mg Oral Q6H PRN Lurena Nida, NP      . alum & mag hydroxide-simeth (MAALOX/MYLANTA) 200-200-20 MG/5ML suspension 30 mL  30 mL Oral Q4H  PRN Lurena Nida, NP      . carvedilol (COREG) tablet 12.5 mg  12.5 mg Oral BID WC Waylan Boga, NP   12.5 mg at 06/14/13 0919  . chlordiazePOXIDE (LIBRIUM) capsule 25 mg  25 mg Oral Q6H PRN Waylan Boga, NP      . chlordiazePOXIDE (LIBRIUM)  capsule 25 mg  25 mg Oral Once Waylan Boga, NP      . chlordiazePOXIDE (LIBRIUM) capsule 25 mg  25 mg Oral TID Waylan Boga, NP   25 mg at 06/14/13 0920   Followed by  . [START ON 06/15/2013] chlordiazePOXIDE (LIBRIUM) capsule 25 mg  25 mg Oral BH-qamhs Waylan Boga, NP       Followed by  . [START ON 06/16/2013] chlordiazePOXIDE (LIBRIUM) capsule 25 mg  25 mg Oral Daily Waylan Boga, NP      . hydrOXYzine (ATARAX/VISTARIL) tablet 25 mg  25 mg Oral Q6H PRN Waylan Boga, NP      . loperamide (IMODIUM) capsule 2-4 mg  2-4 mg Oral PRN Waylan Boga, NP      . magnesium hydroxide (MILK OF MAGNESIA) suspension 30 mL  30 mL Oral Daily PRN Lurena Nida, NP      . multivitamin with minerals tablet 1 tablet  1 tablet Oral Daily Waylan Boga, NP   1 tablet at 06/14/13 0920  . ondansetron (ZOFRAN-ODT) disintegrating tablet 4 mg  4 mg Oral Q6H PRN Waylan Boga, NP      . QUEtiapine (SEROQUEL XR) 24 hr tablet 100 mg  100 mg Oral Daily Waylan Boga, NP   100 mg at 06/14/13 0920  . QUEtiapine (SEROQUEL XR) 24 hr tablet 300 mg  300 mg Oral QHS Waylan Boga, NP      . thiamine (B-1) injection 100 mg  100 mg Intramuscular Once Waylan Boga, NP      . thiamine (VITAMIN B-1) tablet 100 mg  100 mg Oral Daily Waylan Boga, NP   100 mg at 06/13/13 1519    Lab Results:  Results for orders placed during the hospital encounter of 06/12/13 (from the past 48 hour(s))  CBC     Status: None   Collection Time    06/12/13  4:24 PM      Result Value Ref Range   WBC 7.7  4.0 - 10.5 K/uL   RBC 4.43  4.22 - 5.81 MIL/uL   Hemoglobin 13.5  13.0 - 17.0 g/dL   HCT 41.2  39.0 - 52.0 %   MCV 93.0  78.0 - 100.0 fL   MCH 30.5  26.0 - 34.0 pg   MCHC 32.8  30.0 - 36.0 g/dL   RDW 13.6  11.5 - 15.5 %   Platelets 235  150 - 400 K/uL  COMPREHENSIVE METABOLIC PANEL     Status: Abnormal   Collection Time    06/12/13  4:24 PM      Result Value Ref Range   Sodium 140  137 - 147 mEq/L   Potassium 3.8  3.7 - 5.3 mEq/L   Chloride 101   96 - 112 mEq/L   CO2 26  19 - 32 mEq/L   Glucose, Bld 84  70 - 99 mg/dL   BUN 10  6 - 23 mg/dL   Creatinine, Ser 1.07  0.50 - 1.35 mg/dL   Calcium 9.1  8.4 - 10.5 mg/dL   Total Protein 7.3  6.0 - 8.3 g/dL   Albumin 3.9  3.5 - 5.2 g/dL   AST 43 (*)  0 - 37 U/L   ALT 18  0 - 53 U/L   Alkaline Phosphatase 60  39 - 117 U/L   Total Bilirubin 0.4  0.3 - 1.2 mg/dL   GFR calc non Af Amer 72 (*) >90 mL/min   GFR calc Af Amer 83 (*) >90 mL/min   Comment: (NOTE)     The eGFR has been calculated using the CKD EPI equation.     This calculation has not been validated in all clinical situations.     eGFR's persistently <90 mL/min signify possible Chronic Kidney     Disease.  ETHANOL     Status: None   Collection Time    06/12/13  4:24 PM      Result Value Ref Range   Alcohol, Ethyl (B) <11  0 - 11 mg/dL   Comment:            LOWEST DETECTABLE LIMIT FOR     SERUM ALCOHOL IS 11 mg/dL     FOR MEDICAL PURPOSES ONLY    Physical Findings: AIMS: Facial and Oral Movements Muscles of Facial Expression: None, normal Lips and Perioral Area: None, normal Jaw: None, normal Tongue: None, normal,Extremity Movements Upper (arms, wrists, hands, fingers): None, normal Lower (legs, knees, ankles, toes): None, normal, Trunk Movements Neck, shoulders, hips: None, normal, Overall Severity Severity of abnormal movements (highest score from questions above): None, normal Incapacitation due to abnormal movements: None, normal Patient's awareness of abnormal movements (rate only patient's report): No Awareness, Dental Status Current problems with teeth and/or dentures?: Yes Does patient usually wear dentures?: No  CIWA:  CIWA-Ar Total: 5 COWS:     Treatment Plan Summary: Daily contact with patient to assess and evaluate symptoms and progress in treatment Medication management  Plan:1. Admit for crisis management and stabilization. 2. Medication management to reduce current symptoms to base line and  improve the  patient's overall level of functioning: Patient to continue Seroquel 15m qam and 3087mQhs for mood stabilization. 3. Treat health problems as indicated. 4. Develop treatment plan to decrease risk of relapse upon discharge and the need for readmission. 5. Psycho-social education regarding relapse prevention and self care. 6. Health care follow up as needed for medical problems. 7. Restart home medications where appropriate.   Medical Decision Making Problem Points:  Established problem, stable/improving (1), Review of last therapy session (1) and Review of psycho-social stressors (1) Data Points:  Order Aims Assessment (2) Review of medication regiment & side effects (2) Review of new medications or change in dosage (2)  I certify that inpatient services furnished can reasonably be expected to improve the patient's condition.   AkCorena PilgrimMD 06/14/2013, 3:21 PM

## 2013-06-14 NOTE — Progress Notes (Signed)
Psychoeducational Group Note  Date:  06/14/2013 Time:  1015  Group Topic/Focus:  Making Healthy Choices:   The focus of this group is to help patients identify negative/unhealthy choices they were using prior to admission and identify positive/healthier coping strategies to replace them upon discharge.  Participation Level:  Active  Participation Quality:  Appropriate  Affect:  Appropriate  Cognitive:  Oriented  Insight:  Improving  Engagement in Group:  Engaged  Additional Comments:    Sabena Winner A 06/14/2013 

## 2013-06-14 NOTE — [facility] Group Notes (Signed)
[facility] Group Notes: (Clinical Social Work)   06/14/2013      Type of Therapy:  Group Therapy   Participation Level:  Did Not Attend - came in for a little bit, having just woken up, but was called out to see the doctor   Ambrose MantleMareida Grossman-Orr, LCSW 06/14/2013, 5:04 PM

## 2013-06-14 NOTE — Progress Notes (Signed)
Late entry for 06/13/2013. Written on 06/14/2013  Psychoeducational Group Note    Date: 06/14/2013 Time:  0930  Goal Setting Purpose of Group: To be able to set a goal that is measurable and that can be accomplished in one day Participation Level:  Active  Participation Quality:  Appropriate  Affect:  Appropriate  Cognitive:  Oriented  Insight:  Improving  Engagement in Group:  Engaged  Additional Comments:  Participating and engaged.  Margaruite Top A 

## 2013-06-14 NOTE — Progress Notes (Signed)
Pt has been resting in bed since 2200. No distress observed, complaints voiced. Level III obs in place for safety and pt is safe. Lawrence MarseillesFriedman, Soo Steelman Eakes

## 2013-06-14 NOTE — Progress Notes (Signed)
Met with pt 1:1 who reports having a good day. Denying any withdrawal symptoms - CIWA "0". VSS. While chart indicates he endorsed passive SI earlier he is able to deny at this time. Medicated per orders. Supported, encouraged. Denies SI/HI/AVH and remains safe. Jamie Kato

## 2013-06-14 NOTE — Progress Notes (Signed)
Psychoeducational Group Note  Date: 06/14/2013 Time:  0930 Group Topic/Focus:  Gratefulness:  The focus of this group is to help patients identify what two things they are most grateful for in their lives. What helps ground them and to center them on their work to their recovery.  Participation Level:  Active  Participation Quality:  Appropriate  Affect:  Appropriate  Cognitive:  Oriented  Insight:  Improving  Engagement in Group:  Engaged  Additional Comments:  participated in the group.  Verdene Creson A   

## 2013-06-14 NOTE — Progress Notes (Signed)
Late entry for 06-13-2013 written on 06-14-2013   Psychoeducational Group Note  Date: 06/14/2013 Time:  1015  Group Topic/Focus:  Identifying Needs:   The focus of this group is to help patients identify their personal needs that have been historically problematic and identify healthy behaviors to address their needs.  Participation Level:  Active  Participation Quality:  Appropriate  Affect:  Appropriate  Cognitive:  Oriented  Insight:  Improving  Engagement in Group:  Engaged  Additional Comments:   Jenille Laszlo A 

## 2013-06-15 NOTE — [facility] Group Notes (Signed)
.  [facility] LCSW Group Therapy          Overcoming Obstacles       1:15 -2:30        06/15/2013   2:45 PM     Type of Therapy:  Group Therapy  Participation Level:  Appropriate  Participation Quality:  Appropriate  Affect:  Appropriate, Alert  Cognitive:  Attentive Appropriate  Insight: Developing/Improving Engaged  Engagement in Therapy: Developing/Imprvoing Engaged  Modes of Intervention:  Discussion Exploration  Education Rapport BuildingProblem-Solving Support  Summary of Progress/Problems:  The main focus of today's group was overcoming obstacles.  He advised his inability to stay sober and keep housing are the obstacle he has to overcome.  Patient able to identify appropriate coping skills.   [name], Dylan Jacobson 06/15/2013   2:45 PM

## 2013-06-15 NOTE — [facility] Group Notes (Signed)
[facility]   06/15/2013 9:45 AM  Participation Quality:  Did not attend group.  Ambria Mayfield, Joesph JulyQuylle Hairston

## 2013-06-15 NOTE — Progress Notes (Signed)
Patient ID: [name], male   DOB: 12/17/1950, 63 y.o.   MRN: [id number] D- Patient has ben up and attending all groups.  He denies any thoughts of suicide or any with drawal symptoms.  He does report feeling dizzy at times and said he thought he was going to fall while standing so long in cafeteria lunch line.  A- Reviewed fall precautions with patient.  Encouraged him to use handrails in hallway and to tell staff anytime he is having feelings of dizziness.  R- patient says he will.  Unit staff alerted to have patient sit while waiting in lunch line.  Patient  given red socks and a yellow bracelet and appropriate signage initiated.   Patient is cooperative and shows a sense of humor.

## 2013-06-15 NOTE — Progress Notes (Signed)
Patient ID: Dylan Jacobson, male   DOB: 10/14/1950, 63 y.o.   MRN: [id number] PER STATE REGULATIONS 482.30  THIS CHART WAS REVIEWED FOR MEDICAL NECESSITY WITH RESPECT TO THE PATIENT'S ADMISSION/ DURATION OF STAY.  NEXT REVIEW DATE: 06/16/2013  Willa RoughJENNIFER JONES Briyan Kleven, RN, BSN CASE MANAGER

## 2013-06-15 NOTE — Tx Team (Signed)
Interdisciplinary Treatment Plan Update   Date Reviewed:  06/15/2013  Time Reviewed:  8:25 AM  Progress in Treatment:   Attending groups: Yes Participating in groups: Yes Taking medication as prescribed: Yes  Tolerating medication: Yes Family/Significant other contact made:  No, but will ask patient for consent for collateral contact Patient understands diagnosis: Yes  Discussing patient identified problems/goals with staff: Yes Medical problems stabilized or resolved: Yes Denies suicidal/homicidal ideation: Yes Patient has not harmed self or others: Yes  For review of initial/current patient goals, please see plan of care.  Estimated Length of Stay:  2-4  Reasons for Continued Hospitalization:  Anxiety Depression Medication stabilization Suicidal ideation  New Problems/Goals identified:    Discharge Plan or Barriers:   Home with outpatient follow up to be determined  Additional Comments:   63 y.o. widowed white male. [facility], reportedly at the recommendation of a [employer] priest to whom he spoke earlier today. He problems with alcohol, along with SI. Stressors: Pt reports that about 1 month ago he relapsed on alcohol. He lives with a friend that he has known for about 1 year, and the alcohol abuse reportedly contributed to conflict between the two of them. Pt is uncertain whether or not he is welcome back into the household. He has no other social supports, being alienated from his brother due to his drinking problems. Pt's mother died in a nursing home about 1 year ago, and pt feels guilty that he did not take care of her himself. Suicidality: Pt states, "I almost took my pills today." He refers to prescription Seroquel that he has not been taking, instead hoarding it in anticipation of a suicide attempt. He reports that earlier today he looked for a place where he could overdose and remain undisturbed. He states, "I don't like myself and I don't want to go  on." He attributes his decision to come to the ED to the intervention of the priest and pt's concern about afterlife. However, pt reports that he has overdosed on 3 occasions in the past, twice by overdose, and most recently by a combination of overdosing and cutting his arms bilaterally, deeply enough to require sutures. This attempt was 8 months ago. He denies any other history of self mutilation. Pt endorses depressed mood with symptoms noted in the "risk to self" assessment below. He also reports that he has not eaten in 4 - 5 days, and has lost about 20# in the past month.     Attendees:  Patient:  06/15/2013 8:25 AM   Signature: Mervyn GayJ. Jonnalagadda, MD 06/15/2013 8:25 AM  Signature:   06/15/2013 8:25 AM  Signature:  Claudette Headonrad Withrow, NP 06/15/2013 8:25 AM  Signature:Beverly Terrilee CroakKnight, RN 06/15/2013 8:25 AM  Signature:  Neill Loftarol Davis RN 06/15/2013 8:25 AM  Signature:  Juline PatchQuylle Harshitha Fretz, LCSW 06/15/2013 8:25 AM  Signature:  Reyes Ivanhelsea Horton, LCSW 06/15/2013 8:25 AM  Signature:  Leisa LenzValerie Enoch, Care Coordinator [facility] 06/15/2013 8:25 AM  Signature:  Aloha GellKrista Dopson, RN 06/15/2013 8:25 AM  Signature:  06/15/2013  8:25 AM  Signature:   Onnie BoerJennifer Clark, RN URCM 06/15/2013  8:25 AM  Signature:  06/15/2013  8:25 AM    Scribe for Treatment Team:   Juline PatchQuylle Michalene Debruler,  06/15/2013 8:25 AM

## 2013-06-15 NOTE — Progress Notes (Signed)
Patient ID: Dylan Jacobson, male   DOB: 01/11/1951, 63 y.o.   MRN: [id number] [facility]  06/15/2013 6:40 PM Dylan Jacobson  MRN:  [id number] Subjective:  During today's assessment, pt reports anxiety at 5/10 and depression at 5/10, stating that he has improved a lot since yesterday. Pt reports a major improvement in appetite and sleep as well. Pt denies SI, HI, and AVH, contracts for safety. Pt reports continued benefit from participation in group therapy and is in agreement with medication and treatment plan.   Diagnosis:   DSM5: Schizophrenia Disorders:   Obsessive-Compulsive Disorders:   Trauma-Stressor Disorders:   Substance/Addictive Disorders:  Alcohol Related Disorder - Severe (303.90) Depressive Disorders:  Major Depressive Disorder - Severe (296.23) Total Time spent with patient: 20 minutes  Axis I: Major depressive disorder- recurrent episode severe  Alcohol dependence  Axis II: Deferred Axis III:  Past Medical History  Diagnosis Date  . Hypertension   . Degenerative disc disease 06/12/2013  . 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   Axis IV: other psychosocial or environmental problems and problems related to social environment  ADL's:  Intact  Sleep: Fair  Appetite:  Fair  Suicidal Ideation: yes   Denies, contracts for safety Homicidal Ideation: denies  AEB (as evidenced by):  Psychiatric Specialty Exam: Physical Exam  Psychiatric: His speech is normal. His mood appears anxious. He is slowed and withdrawn. Cognition and memory are normal. He expresses impulsivity. He exhibits a depressed mood. He expresses suicidal ideation.    Review of Systems  Constitutional: Negative.   HENT: Negative.   Eyes: Negative.   Respiratory: Negative.   Cardiovascular: Negative.   Gastrointestinal: Negative.   Genitourinary: Negative.   Musculoskeletal: Negative.   Skin: Negative.   Neurological: Positive for tremors.   Endo/Heme/Allergies: Negative.   Psychiatric/Behavioral: Positive for depression and substance abuse. Negative for suicidal ideas. The patient is nervous/anxious. The patient does not have insomnia.     Blood pressure 113/82, pulse 108, temperature 97.9 F (36.6 C), temperature source Oral, resp. rate 16, height 5\' 7"  (1.702 m), weight 76.204 kg (168 lb).Body mass index is 26.31 kg/(m^2).  General Appearance: Disheveled and poor grooming  Eye Contact::  Good  Speech:  Clear and Coherent  Volume:  Decreased  Mood:  Euthymic  Affect:  Constricted  Thought Process:  Goal Directed  Orientation:  Full (Time, Place, and Person)  Thought Content:  Negative  Suicidal Thoughts:  No  Homicidal Thoughts:  No  Memory:  Immediate;   Fair Recent;   Fair Remote;   Fair  Judgement:  Fair  Insight:  Shallow  Psychomotor Activity:  Decreased  Concentration:  Fair  Recall:  [employer]Fair  Fund of Knowledge:Fair  Language: Good  Akathisia:  No  Handed:  Right  AIMS (if indicated):     Assets:  Communication Skills Desire for Improvement  Sleep:  Number of Hours: 6.25   Musculoskeletal: Strength & Muscle Tone: within normal limits Gait & Station: normal Patient leans: N/A  Current Medications: Current Facility-Administered Medications  Medication Dose Route Frequency Provider Last Rate Last Dose  . acetaminophen (TYLENOL) tablet 650 mg  650 mg Oral Q6H PRN Kristeen MansFran E Hobson, NP      . alum & mag hydroxide-simeth (MAALOX/MYLANTA) 200-200-20 MG/5ML suspension 30 mL  30 mL Oral Q4H PRN Kristeen MansFran E Hobson, NP      . carvedilol (COREG) tablet 12.5 mg  12.5 mg Oral BID WC Nanine MeansJamison Lord,  NP   12.5 mg at 06/15/13 1640  . chlordiazePOXIDE (LIBRIUM) capsule 25 mg  25 mg Oral Q6H PRN Nanine Means, NP      . chlordiazePOXIDE (LIBRIUM) capsule 25 mg  25 mg Oral Once Nanine Means, NP      . chlordiazePOXIDE (LIBRIUM) capsule 25 mg  25 mg Oral BH-qamhs Nanine Means, NP   25 mg at 06/15/13 0851   Followed by  . [START ON  06/16/2013] chlordiazePOXIDE (LIBRIUM) capsule 25 mg  25 mg Oral Daily Nanine Means, NP      . feeding supplement (ENSURE COMPLETE) (ENSURE COMPLETE) liquid 237 mL  237 mL Oral BID BM Jeoffrey Massed, RD   237 mL at 06/15/13 1438  . hydrOXYzine (ATARAX/VISTARIL) tablet 25 mg  25 mg Oral Q6H PRN Nanine Means, NP      . loperamide (IMODIUM) capsule 2-4 mg  2-4 mg Oral PRN Nanine Means, NP      . magnesium hydroxide (MILK OF MAGNESIA) suspension 30 mL  30 mL Oral Daily PRN Kristeen Mans, NP      . multivitamin with minerals tablet 1 tablet  1 tablet Oral Daily Nanine Means, NP   1 tablet at 06/15/13 0851  . ondansetron (ZOFRAN-ODT) disintegrating tablet 4 mg  4 mg Oral Q6H PRN Nanine Means, NP      . QUEtiapine (SEROQUEL XR) 24 hr tablet 100 mg  100 mg Oral Daily Nanine Means, NP   100 mg at 06/15/13 0851  . QUEtiapine (SEROQUEL XR) 24 hr tablet 300 mg  300 mg Oral QHS Nanine Means, NP   300 mg at 06/14/13 2058  . thiamine (B-1) injection 100 mg  100 mg Intramuscular Once Nanine Means, NP      . thiamine (VITAMIN B-1) tablet 100 mg  100 mg Oral Daily Nanine Means, NP   100 mg at 06/15/13 1610    Lab Results:  No results found for this or any previous visit (from the past 48 hour(s)).  Physical Findings: AIMS: Facial and Oral Movements Muscles of Facial Expression: None, normal Lips and Perioral Area: None, normal Jaw: None, normal Tongue: None, normal,Extremity Movements Upper (arms, wrists, hands, fingers): None, normal Lower (legs, knees, ankles, toes): None, normal, Trunk Movements Neck, shoulders, hips: None, normal, Overall Severity Severity of abnormal movements (highest score from questions above): None, normal Incapacitation due to abnormal movements: None, normal Patient's awareness of abnormal movements (rate only patient's report): No Awareness, Dental Status Current problems with teeth and/or dentures?: No Does patient usually wear dentures?: No  CIWA:  CIWA-Ar Total: 1 COWS:      Treatment Plan Summary: Daily contact with patient to assess and evaluate symptoms and progress in treatment Medication management  Plan:1. Admit for crisis management and stabilization. 2. Medication management to reduce current symptoms to base line and improve the  patient's overall level of functioning:  Patient to continue Seroquel 100 mg qam and 300 mg Qhs for mood stabilization. 3. Treat health problems as indicated. 4. Develop treatment plan to decrease risk of relapse upon discharge and the need for readmission. 5. Psycho-social education regarding relapse prevention and self care. 6. Health care follow up as needed for medical problems. 7. Restart home medications where appropriate.   Medical Decision Making Problem Points:  Established problem, stable/improving (1), Review of last therapy session (1) and Review of psycho-social stressors (1) Data Points:  Order Aims Assessment (2) Review of medication regiment & side effects (2) Review of new medications or  change in dosage (2)  I certify that inpatient services furnished can reasonably be expected to improve the patient's condition.   Beau Fanny, FNP-BC 06/15/2013, 6:40 PM  Reviewed the information documented and agree with the treatment plan.  Tamaira Ciriello,JANARDHAHA R. 06/16/2013 2:28 PM

## 2013-06-15 NOTE — Progress Notes (Signed)
D Pt.  Denies SI and HI, was sleeping and had to be  Awakened for assessment.  A  Writer offered support and encouragement.  R Pt. remains safe on the unit.  Pt. Reports that his depression has reduced from a  10 to a 4 and his anxiety remains at an 8.  States he will try to take the negative thoughts and turn them into positive ones for a coping skill  Pt. Is hoping for a long term residential treatment when discharging, is unsure where he will  Be going at this time.

## 2013-06-16 DIAGNOSIS — R45851 Suicidal ideations: Secondary | ICD-10-CM

## 2013-06-16 NOTE — Progress Notes (Signed)
Patient ID: Dylan MooreMitchel Jacobson, male   DOB: 02/27/1951, 63 y.o.   MRN: [id number] PER STATE REGULATIONS 482.30  THIS CHART WAS REVIEWED FOR MEDICAL NECESSITY WITH RESPECT TO THE PATIENT'S ADMISSION/ DURATION OF STAY.  NEXT REVIEW DATE: 06/21/2013  Willa RoughJENNIFER JONES [name], RN, BSN CASE MANAGER

## 2013-06-16 NOTE — Progress Notes (Signed)
Recreation Therapy Notes  Date: 02.16.2015 Time: 2:45pm Location: [address] Dayroom   Group Topic: Wellness  Goal Area(s) Addresses:  Patient will define components of whole wellness. Patient will verbalize benefit of whole wellness. Patient will identified how neglecting wellness exacerbates depression/anxiety/substance abuse/ etc.  Behavioral Response: Sleeping   Intervention: Mind Map  Activity: Patients were asked to identify and define dimensions of wellness - Physical, Mental, Emotional, Financial, Social, Leisure, Intellectual, Environmental, and Spiritual. Patient were then asked to identify what activities/actions they can participate in to invest in those dimensions of wellness. Discussion focused on meaningful investment in wellness and consequences for neglecting wellness.   Education: Wellness, Discharge Planning   Education Outcome: Needs additional education  Clinical Observations/Feedback: Patient attended group session, but was observed to sleep during session.   Marykay Lexenise L Jaevin Medearis, LRT/CTRS  Shanequa Whitenight L 06/16/2013 8:50 AM

## 2013-06-16 NOTE — [facility] Group Notes (Signed)
[facility] LCSW Group Therapy      Feelings About Diagnosis 1:15 - 2:30 PM         06/16/2013  2:39 PM    Type of Therapy:  Group Therapy  Participation Level:  Minimal  Participation Quality:  Appropriate  Affect:  Drowsy  Cognitive:  Alert and Appropriate  Insight:  Developing/Improving   Engagement in Therapy:  Developing/Improving  Modes of Intervention:  Discussion, Education, Exploration, Problem-Solving, Rapport Building, Support  Summary of Progress/Problems:  Patient actively participated in group. Patient discussed past and present diagnosis and the effects it has had on  life.  Patient talked about family and society being judgmental and the stigma associated with having a mental health diagnosis.  He shared he sometimes feels out of control and does not know if it is the depression or the alcohol but knows he needs help.   Dylan Jacobson, Dylan Jacobson 06/16/2013  2:39 PM

## 2013-06-16 NOTE — Progress Notes (Signed)
Patient ID: Dylan Jacobson Riel, male   DOB: 10/08/1950, 63 y.o.   MRN: [id number] [facility]  06/16/2013 2:50 PM Dylan Jacobson Siegfried  MRN:  [id number]  Subjective: "I am still feeling suicidal and depressed.''  Objective: [facility] with long history of depression and comorbid alcohol dependence. Patient reports he was referred to the hospital by his catholic priest and he was taken bus ride to the hospital and seeking help for alcohol detox treatment and also depression with suicidal ideation. Patient reported he has been drinking since he was 63 years old and currently drinking 24 cans a day. Patient stated his depression as a 4/10 and anxiety 4/10 and feels better since being admitted and denies current suicidal or homicidal ideations and contracts for safety. Patient is hoping to be placed in a rehabilitation services upon completing detox treatment and treatment for depression. He reports feeling lonely, stressed out, hopeless, worthless and reportedly sleep is getting better. He is requesting for help to get a long term solution to his alcohol and mental problem. He thinks his current medication regimen is working great without adverse reactions. Patient reported he has been staying with a friend's trailer in [place] and [place] [facility] [place]. 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  Diagnosis:   DSM5: Schizophrenia Disorders:   Obsessive-Compulsive Disorders:   Trauma-Stressor Disorders:   Substance/Addictive Disorders:  Alcohol Related Disorder - Severe (303.90) Depressive Disorders:  Major Depressive Disorder - Severe (296.23) Total Time spent with patient: 20 minutes  Axis I: Major depressive disorder- recurrent episode severe  Alcohol dependence  Axis II: Deferred Axis III:  Past Medical History   Diagnosis Date  . Hypertension   . Degenerative disc disease 06/12/2013  . 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   Axis IV: other psychosocial or environmental problems and problems related to social environment  ADL's:  Intact  Sleep: Fair  Appetite:  Fair  Suicidal Ideation: yes   Plan:  overdosing on pills Intent:  denies Means:  denies Homicidal Ideation: denies  AEB (as evidenced by):  Psychiatric Specialty Exam: Physical Exam  Psychiatric: His speech is normal. His mood appears anxious. He is slowed and withdrawn. Cognition and memory are normal. He expresses impulsivity. He exhibits a depressed mood. He expresses suicidal ideation.    Review of Systems  Constitutional: Negative.   HENT: Negative.   Eyes: Negative.   Respiratory: Negative.   Cardiovascular: Negative.   Gastrointestinal: Negative.   Genitourinary: Negative.   Musculoskeletal: Negative.   Skin: Negative.   Neurological: Positive for tremors.  Endo/Heme/Allergies: Negative.   Psychiatric/Behavioral: Positive for depression, suicidal ideas and substance abuse. The patient is nervous/anxious and has insomnia.     Blood pressure 119/75, pulse 78, temperature 98 F (36.7 C), temperature source Oral, resp. rate 16, height 5\' 7"  (1.702 m), weight 76.204 kg (168 lb).Body mass index is 26.31 kg/(m^2).  General Appearance: Disheveled and poor grooming  Eye Contact::  Good  Speech:  Clear and Coherent  Volume:  Decreased  Mood:  Anxious, Depressed and Hopeless  Affect:  Constricted  Thought Process:  Goal Directed  Orientation:  Full (Time, Place, and Person)  Thought Content:  Negative  Suicidal Thoughts:  Yes.  without intent/plan  Homicidal Thoughts:  No  Memory:  Immediate;   Fair  Recent;   Fair Remote;   Fair  Judgement:  Fair  Insight:  Shallow  Psychomotor Activity:  Decreased  Concentration:  Fair  Recall:  [employer] of Knowledge:Fair  Language: Good   Akathisia:  No  Handed:  Right  AIMS (if indicated):     Assets:  Communication Skills Desire for Improvement  Sleep:  Number of Hours: 6.75   Musculoskeletal: Strength & Muscle Tone: within normal limits Gait & Station: normal Patient leans: N/A  Current Medications: Current Facility-Administered Medications  Medication Dose Route Frequency Provider Last Rate Last Dose  . acetaminophen (TYLENOL) tablet 650 mg  650 mg Oral Q6H PRN Kristeen Mans, NP      . alum & mag hydroxide-simeth (MAALOX/MYLANTA) 200-200-20 MG/5ML suspension 30 mL  30 mL Oral Q4H PRN Kristeen Mans, NP      . carvedilol (COREG) tablet 12.5 mg  12.5 mg Oral BID WC Nanine Means, NP   12.5 mg at 06/16/13 9604  . chlordiazePOXIDE (LIBRIUM) capsule 25 mg  25 mg Oral Once Nanine Means, NP      . feeding supplement (ENSURE COMPLETE) (ENSURE COMPLETE) liquid 237 mL  237 mL Oral BID BM Jeoffrey Massed, RD   237 mL at 06/16/13 0840  . magnesium hydroxide (MILK OF MAGNESIA) suspension 30 mL  30 mL Oral Daily PRN Kristeen Mans, NP      . multivitamin with minerals tablet 1 tablet  1 tablet Oral Daily Nanine Means, NP   1 tablet at 06/16/13 0840  . QUEtiapine (SEROQUEL XR) 24 hr tablet 100 mg  100 mg Oral Daily Nanine Means, NP   100 mg at 06/16/13 0840  . QUEtiapine (SEROQUEL XR) 24 hr tablet 300 mg  300 mg Oral QHS Nanine Means, NP   300 mg at 06/15/13 2255  . thiamine (B-1) injection 100 mg  100 mg Intramuscular Once Nanine Means, NP      . thiamine (VITAMIN B-1) tablet 100 mg  100 mg Oral Daily Nanine Means, NP   100 mg at 06/16/13 0840    Lab Results:  No results found for this or any previous visit (from the past 48 hour(s)).  Physical Findings: AIMS: Facial and Oral Movements Muscles of Facial Expression: None, normal Lips and Perioral Area: None, normal Jaw: None, normal Tongue: None, normal,Extremity Movements Upper (arms, wrists, hands, fingers): None, normal Lower (legs, knees, ankles, toes): None, normal,  Trunk Movements Neck, shoulders, hips: None, normal, Overall Severity Severity of abnormal movements (highest score from questions above): None, normal Incapacitation due to abnormal movements: None, normal Patient's awareness of abnormal movements (rate only patient's report): No Awareness, Dental Status Current problems with teeth and/or dentures?: No Does patient usually wear dentures?: No  CIWA:  CIWA-Ar Total: 1 COWS:     Treatment Plan Summary: Daily contact with patient to assess and evaluate symptoms and progress in treatment Medication management  Plan:1. Admit for crisis management and stabilization. 2. Medication management to reduce current symptoms to base line and improve the  patient's overall level of functioning:  Continue alcohol detox treatment and supportive therapy as planned Patient continue Seroquel 100mg  qam and 300mg  Qhs for mood stabilization. 3. Treat health problems as indicated. 4. Develop treatment plan to decrease risk of relapse upon discharge and the need for readmission. 5. Psycho-social education regarding relapse prevention and self care. 6. Health care follow up as needed for medical problems. 7. Restart home medications where appropriate.   Medical Decision Making  Problem Points:  Established problem, stable/improving (1), Review of last therapy session (1) and Review of psycho-social stressors (1) Data Points:  Order Aims Assessment (2) Review of medication regiment & side effects (2) Review of new medications or change in dosage (2)  I certify that inpatient services furnished can reasonably be expected to improve the patient's condition.   Nehemiah Settle., MD 06/16/2013, 2:50 PM

## 2013-06-16 NOTE — Progress Notes (Signed)
D: pt is calm and cooperative. Pt is minimal with interaction. Denies si/hi/avh. denies pain.  A: q 15 min safety checks. Scheduled medications given R: pt remains safe on unit. No signs of distress or complaints at this time.

## 2013-06-16 NOTE — Progress Notes (Signed)
Patient ID: Dylan MooreMitchel Mcgaughey, male   DOB: 08/03/1950, 63 y.o.   MRN: [id number] D- Patient reports he slept well and his appetite is improving.  He is rating his depression t 3/10 and his hopelessness at 4/10.  He denies thoughts of self harm and he dnies withdrawal symptoms except a little anxiety.  A- Supported patient.  R- patient is attending groups and interacting with peers and staff.

## 2013-06-16 NOTE — Progress Notes (Signed)
Patient ID: [name], male   DOB: 09/02/1950, 63 y.o.   MRN: [id number] D: Pt. In bed, eyes closed, respirations even. A: Writer observed for s/s of distress. R: No distress noted. Respirations unlabored.

## 2013-06-16 NOTE — Progress Notes (Signed)
Patient ID: [name], male   DOB: 07/28/1950, 63 y.o.   MRN: [id number]  Patient asleep; no s/s of distress noted at this time. Respirations regular and unlabored.

## 2013-06-16 NOTE — Progress Notes (Signed)
The focus of this group is to educate the patient on the purpose and policies of crisis stabilization and provide a format to answer questions about their admission.  The group details unit policies and expectations of patients while admitted. Patient attended group, did exercises and was attentive but did not contribute to conversation.

## 2013-06-17 NOTE — Progress Notes (Signed)
Pt was sleeping in bed so he did not attend group.

## 2013-06-17 NOTE — Progress Notes (Signed)
D: Patient in bed on approach.  Patient states he is tired.  Patient states he feels depressed.  Patient states he had a rough day but would not elaborate. Patient denies SI/HI and denies aVH A: Staff to monitor Q 15 mins for safety.  Encouragement and support offered.  No Scheduled medications administered because patient would not come to the medication window but writer woke him up. R: Patient remains safe on the unit.  Patient did not  attend group tonight.  Patient not visible on the unit and not interacting with peers.

## 2013-06-17 NOTE — Progress Notes (Signed)
Adult Psychoeducational Group Note  Date:  06/17/2013 Time:  10:34 AM  Group Topic/Focus:  Personal Choices and Values:   The focus of this group is to help patients assess and explore the importance of values in their lives, how their values affect their decisions, how they express their values and what opposes their expression.  Participation Level:  Active  Participation Quality:  Appropriate and Attentive  Affect:  Appropriate  Cognitive:  Alert and Appropriate  Insight: Appropriate  Engagement in Group:  Engaged  Modes of Intervention:  Discussion and Education  Additional Comments:  Pt attended and participated in group. Discussion was on personal development. Pt stated personal development means talking to someone (Dr.,nurse, friend) about how Im feeling.  Shelly BombardGarner, Alayshia Marini D 06/17/2013, 10:34 AM

## 2013-06-17 NOTE — Progress Notes (Signed)
Patient ID: Dylan Jacobson Nordin, male   DOB: 10/02/1950, 63 y.o.   MRN: [id number] [facility] Progress Note  06/17/2013 12:27 PM Dylan Jacobson Maack  MRN:  [id number]  Subjective: "I am still feeling suicidal and depressed.'' HPI: : Patient [facility] with long history of depression and comorbid alcohol dependence. Patient reports he was referred to the hospital by his catholic priest and he was taken bus ride to the hospital and seeking help for alcohol detox treatment and also depression with suicidal ideation. Patient reported he has been drinking since he was 63 years old and currently drinking 24 cans a day. Patient stated his depression as a 4/10 and anxiety 4/10 and feels better since being admitted and denies current suicidal or homicidal ideations and contracts for safety. Patient is hoping to be placed in a rehabilitation services upon completing detox treatment and treatment for depression. He reports feeling lonely, stressed out, hopeless, worthless and reportedly sleep is getting better. He is requesting for help to get a long term solution to his alcohol and mental problem. He thinks his current medication regimen is working great without adverse reactions. Patient reported he has been staying with a friend's trailer in [place] and was stayed in [place] before [facility]. 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  Assessment: During today's assessment, pt rates anxiety at 6/10 and depression at 6/10, stating that he is improving over yesterday's assessment. Pt affirms compliance with medication and treatment regimen, including group therapy and is pleased with the progress of both of these. Pt denies SI, HI, and AVH, contracts for safety. Pt also reports good sleep and appetite, stating that they have both improved with medication management.    Diagnosis:   DSM5: Schizophrenia Disorders:   Obsessive-Compulsive Disorders:   Trauma-Stressor Disorders:   Substance/Addictive Disorders:  Alcohol Related Disorder - Severe (303.90) Depressive Disorders:  Major Depressive Disorder - Severe (296.23) Total Time spent with patient: 25 minutes  Axis I: Major depressive disorder- recurrent episode severe  Alcohol dependence  Axis II: Deferred Axis III:  Past Medical History  Diagnosis Date  . Hypertension   . Degenerative disc disease 06/12/2013  . 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   Axis IV: other psychosocial or environmental problems and problems related to social environment  ADL's:  Intact  Sleep: Fair  Appetite:  Fair  Suicidal Ideation: yes   Denies Homicidal Ideation: Denies  AEB (as evidenced by):  Psychiatric Specialty Exam: Physical Exam  Psychiatric: His speech is normal. His mood appears anxious. He is slowed and withdrawn. Cognition and memory are normal. He expresses impulsivity. He exhibits a depressed mood. He expresses suicidal ideation.    Review of Systems  Constitutional: Negative.   HENT: Negative.   Eyes: Negative.   Respiratory: Negative.   Cardiovascular: Negative.   Gastrointestinal: Negative.   Genitourinary: Negative.   Musculoskeletal: Negative.   Skin: Negative.   Neurological: Positive for tremors.  Endo/Heme/Allergies: Negative.   Psychiatric/Behavioral: Positive for depression. Negative for suicidal ideas and substance abuse. The patient is nervous/anxious. The patient does not have insomnia.     Blood pressure 114/72, pulse 94, temperature 97.7 F (36.5 C), temperature source Oral, resp. rate 16, height 5\' 7"  (1.702 m), weight 76.204 kg (168 lb).Body mass index is 26.31 kg/(m^2).  General Appearance: Disheveled and poor grooming  Eye Contact::  Good  Speech:  Clear and Coherent  Volume:  Normal  Mood:  Depressed  Affect:  Constricted   Thought Process:  Goal Directed  Orientation:  Full (Time, Place, and Person)  Thought Content:  Negative  Suicidal Thoughts:  No  Homicidal Thoughts:  No  Memory:  Immediate;   Fair Recent;   Fair Remote;   Fair  Judgement:  Fair  Insight:  Shallow  Psychomotor Activity:  Decreased  Concentration:  Fair  Recall:  [employer] of Knowledge:Fair  Language: Good  Akathisia:  No  Handed:  Right  AIMS (if indicated):     Assets:  Communication Skills Desire for Improvement  Sleep:  Number of Hours: 6.25   Musculoskeletal: Strength & Muscle Tone: within normal limits Gait & Station: normal Patient leans: N/A  Current Medications: Current Facility-Administered Medications  Medication Dose Route Frequency Provider Last Rate Last Dose  . acetaminophen (TYLENOL) tablet 650 mg  650 mg Oral Q6H PRN Kristeen Mans, NP   650 mg at 06/17/13 0840  . alum & mag hydroxide-simeth (MAALOX/MYLANTA) 200-200-20 MG/5ML suspension 30 mL  30 mL Oral Q4H PRN Kristeen Mans, NP      . carvedilol (COREG) tablet 12.5 mg  12.5 mg Oral BID WC Nanine Means, NP   12.5 mg at 06/17/13 0840  . chlordiazePOXIDE (LIBRIUM) capsule 25 mg  25 mg Oral Once Nanine Means, NP      . feeding supplement (ENSURE COMPLETE) (ENSURE COMPLETE) liquid 237 mL  237 mL Oral BID BM Jeoffrey Massed, RD   237 mL at 06/17/13 0932  . magnesium hydroxide (MILK OF MAGNESIA) suspension 30 mL  30 mL Oral Daily PRN Kristeen Mans, NP      . multivitamin with minerals tablet 1 tablet  1 tablet Oral Daily Nanine Means, NP   1 tablet at 06/17/13 0841  . QUEtiapine (SEROQUEL XR) 24 hr tablet 100 mg  100 mg Oral Daily Nanine Means, NP   100 mg at 06/17/13 0841  . QUEtiapine (SEROQUEL XR) 24 hr tablet 300 mg  300 mg Oral QHS Nanine Means, NP   300 mg at 06/16/13 2249  . thiamine (B-1) injection 100 mg  100 mg Intramuscular Once Nanine Means, NP      . thiamine (VITAMIN B-1) tablet 100 mg  100 mg Oral Daily Nanine Means, NP   100 mg at 06/17/13 4540     Lab Results:  No results found for this or any previous visit (from the past 48 hour(s)).  Physical Findings: AIMS: Facial and Oral Movements Muscles of Facial Expression: None, normal Lips and Perioral Area: None, normal Jaw: None, normal Tongue: None, normal,Extremity Movements Upper (arms, wrists, hands, fingers): None, normal Lower (legs, knees, ankles, toes): None, normal, Trunk Movements Neck, shoulders, hips: None, normal, Overall Severity Severity of abnormal movements (highest score from questions above): None, normal Incapacitation due to abnormal movements: None, normal Patient's awareness of abnormal movements (rate only patient's report): No Awareness, Dental Status Current problems with teeth and/or dentures?: No Does patient usually wear dentures?: No  CIWA:  CIWA-Ar Total: 1 COWS:     Treatment Plan Summary: Daily contact with patient to assess and evaluate symptoms and progress in treatment Medication management  Plan: 1. Admit for crisis management and stabilization. 2. Medication management to reduce current symptoms to base line and improve the  patient's overall level of functioning:  Continue alcohol detox treatment and supportive therapy as planned Continue  Seroquel 100mg  qam and 300mg  Qhs for mood stabilization. 3. Treat health problems as indicated. 4. Develop treatment plan to decrease risk of relapse upon discharge and the need for readmission. 5. Psycho-social education regarding relapse prevention and self care. 6. Health care follow up as needed for medical problems. 7. Restart home medications where appropriate. 8. Disposition plans are in progress and may be discharged on Friday if he continue to contract for safety and able to show clinical improvement  Medical Decision Making Problem Points:  Established problem, stable/improving (1), Review of last therapy session (1) and Review of psycho-social stressors (1) Data Points:  Order Aims  Assessment (2) Review of medication regiment & side effects (2) Review of new medications or change in dosage (2)  I certify that inpatient services furnished can reasonably be expected to improve the patient's condition.   Beau Fanny, FNP-BC 06/17/2013, 12:27 PM  Reviewed the information documented and agree with the treatment plan.  Tiajah Oyster,JANARDHAHA R. 06/17/2013 12:40 PM

## 2013-06-17 NOTE — Progress Notes (Signed)
Patient ID: [name], male   DOB: 08/08/1950, 63 y.o.   MRN: [id number]  D: Pt. Denies SI/HI and A/V Hallucinations to this writer however told CSW that he was having on and off SI. Patient does contract for safety. Patient did report mild pain in his back and received PRN Tylenol however he reports that his pain level was unchanged. Pt is not reporting any significant withdrawal symptoms today.  A: Support and encouragement provided to the patient. Scheduled medications given to patient per physician's orders.  R: Patient is receptive and cooperative but minimal and forwards very little. Q15 minute checks are maintained for safety.

## 2013-06-17 NOTE — Progress Notes (Signed)
Recreation Therapy Notes  Date: 02.18.2015 Time: 2:45pm Location: [address] Dayroom    Group Topic: Boundaries  Goal Area(s) Addresses:  Patient will identify benefit of establishing healthy boundaries.  Patient will identify what is preventing establishing healthy boundaries.   Behavioral Response: Appropriate    Intervention: Art  Activity: Patients were asked to draw their healthy boundary and identify what or who they are/are not comfortable with being a part of their lives.     Education: Healthy Boundaries  Education Outcome: Needs additional education   Clinical Observations/Feedback: Patient attended group, participating in opening discussion, assisting peers define rigid vs weak boundaries and identifying aspects of each. Patient did not remain in group session to participate in group activity.   Dylan Jacobson L Dylan Jacobson, LRT/CTRS   Dylan Jacobson L 06/17/2013 7:30 PM 

## 2013-06-17 NOTE — Tx Team (Signed)
Interdisciplinary Treatment Plan Update   Date Reviewed:  06/17/2013  Time Reviewed:  10:09 AM  Progress in Treatment:   Attending groups: Yes Participating in groups: Yes Taking medication as prescribed: Yes  Tolerating medication: Yes Family/Significant other contact made:  No, patient advised of not having any collateral contacts. Patient understands diagnosis: Yes  Discussing patient identified problems/goals with staff: Yes Medical problems stabilized or resolved: Yes Denies suicidal/homicidal ideation: Yes Patient has not harmed self or others: Yes  For review of initial/current patient goals, please see plan of care.  Estimated Length of Stay:  2 days  Reasons for Continued Hospitalization:  Anxiety Depression Medication stabilization   New Problems/Goals identified:    Discharge Plan or Barriers:   Home with [facility]  Additional Comments:    Attendees:  Patient:  06/17/2013 10:09 AM   Signature: Mervyn GayJ. Jonnalagadda, MD 06/17/2013 10:09 AM  Signature:   06/17/2013 10:09 AM  Signature:  Claudette Headonrad Withrow, NP 06/17/2013 10:09 AM  Signature:    06/17/2013 10:09 AM  Signature:  Barrie Folkawn Placke, RN  06/17/2013 10:09 AM  Signature:  Juline PatchQuylle Kelleen Stolze, LCSW 06/17/2013 10:09 AM  Signature:  Reyes Ivanhelsea Horton, LCSW 06/17/2013 10:09 AM  Signature:  06/17/2013 10:09 AM  Signature:  Aloha GellKrista Dopson, RN 06/17/2013 10:09 AM  Signature:  06/17/2013  10:09 AM  Signature:   Onnie BoerJennifer Clark, RN [facility] 06/17/2013  10:09 AM  Signature:  06/17/2013  10:09 AM    Scribe for Treatment Team:   Juline PatchQuylle Hyatt Capobianco,  06/17/2013 10:09 AM

## 2013-06-17 NOTE — [facility] Group Notes (Signed)
[facility] LCSW Group Therapy  Emotional Regulation 1:15 - 2: 30 PM        06/17/2013     Type of Therapy:  Group Therapy  Participation Level:  Appropriate  Participation Quality:  Appropriate  Affect: Depressed, Flat  Cognitive:  Appropriate  Insight:  Developing/Improving  Engagement in Therapy:  Developing/Improving   Modes of Intervention:  Discussion Exploration Problem-Solving Supportive  Summary of Progress/Problems:  Group topic was emotional regulations.  Patient participated in the discussion and was able to identify an emotion that needed to regulated.  Patient shared he does not have emotions.  He stated he feels empty inside.  Patient stated he is not able to stay out in the cold while waiting for a bed [facility]. Patient unable to identify approprite coping skills due to housing concerns.Dylan Jacobson.  Dylan Jacobson 06/17/2013

## 2013-06-17 NOTE — [facility] Group Notes (Signed)
[facility]   06/17/2013 9:37 AM    Participation Quality:  Appropraite  Mood/Affect:  Appropriate  Depression Rating:  4  Anxiety Rating:  4  Thoughts of Suicide:  No  Will you contract for safety?   NA  Current AVH:  No  Plan for Discharge/Comments:  Patient attended discharge planning group and actively participated in group.  He is hopeful to discharge to  [facility].  Referral made and awaiting call back on possible admission date.  CSW provided all participants with daily workbook.   Transportation Means: Patient has transportation.   Supports:  Patient has a support system.   Kaelie Henigan, Joesph JulyQuylle Hairston

## 2013-06-18 NOTE — Progress Notes (Signed)
Didn't attend group 

## 2013-06-18 NOTE — Progress Notes (Signed)
Recreation Therapy Notes  Date: 02.19.2015 Time: 2:45pm Location: [address] Dayroom   Group Topic: Communication, Team Building, Problem Solving  Goal Area(s) Addresses:  Patient will effectively work with peer towards shared goal.  Patient will identify skill used to make activity successful.  Patient will identify how skills used during activity can be used to reach post d/c goals.   Behavioral Response: Passive Engagement, Appropriate   Intervention: Problem Solving Activitiy  Activity: Life Boat. Patients were given a scenario about being on a sinking yacht. Patients were informed the yacht included 15 guest, 8 of which could be placed on the life boat, along with all group members. Individuals on guest list were of varying socioeconomic classes such as a Education officer, museumriest, Materials engineerresident Obama, MidwifeBus Driver, Tree surgeonTeacher and Chef.   Education: Pharmacist, communityocial Skills, Discharge Planning   Education Outcome: Acknowledges understanding  Clinical Observations/Feedback: Patient attended group session, but had passive engagement in group session. Patient participated via show of hands only and actively listened to group discussion.     Marykay Lexenise L Daneisha Surges, LRT/CTRS  Jearl KlinefelterBlanchfield, Jamin Humphries L 06/18/2013 9:14 PM

## 2013-06-18 NOTE — Progress Notes (Signed)
Patient ID: Tresa MooreMitchel Fogarty, male   DOB: 02/14/1951, 63 y.o.   MRN: [id number]  D: Pt. Denies HI and A/V Hallucinations. Patient did not fill out daily inventory sheet. Pt endorses passive SI but contracts for safety. Patient does not report any pain or discomfort at this time. Patient is disrupting the milieu speaking to other patients about his discharge. Patient is reporting to other staff and patients that he won't go to a shelter and will "die in the cold." Writer spoke with pt and advised him that he needs to talk with the CSW about his issues with placement and not with other patients. Patient stated that he was not telling them anything but writer has seen pt in dayroom discussing this with patients.  A: Support and encouragement provided to the patient. Scheduled medications given to patient per physician's orders.  R: Patient is minimal with Clinical research associatewriter and does not initiate conversation. Patient is seen in the milieu and is attending some groups. Q15 minute checks are maintained for safety.

## 2013-06-18 NOTE — Progress Notes (Signed)
Unable to assess patient on 7p-11p shift.  He was asleep during those hours.

## 2013-06-18 NOTE — Progress Notes (Signed)
Patient ID: Dylan Jacobson, male   DOB: Sep 30, 1950, 63 y.o.   MRN: [id number] [facility] MD Progress Note  06/18/2013 11:15 AM Dylan Jacobson  MRN:  [id number]  Subjective: "I am still feeling suicidal and depressed.'' HPI: : Patient was [facility] from the [facility] department with long history of depression and comorbid alcohol dependence. Patient reports he was referred to the hospital by his catholic priest and he was taken bus ride to the hospital and seeking help for alcohol detox treatment and also depression with suicidal ideation. Patient reported he has been drinking since he was 63 years old and currently drinking 24 cans a day. Patient stated his depression as a 4/10 and anxiety 4/10 and feels better since being admitted and denies current suicidal or homicidal ideations and contracts for safety. Patient is hoping to be placed in a rehabilitation services upon completing detox treatment and treatment for depression. He reports feeling lonely, stressed out, hopeless, worthless and reportedly sleep is getting better. He is requesting for help to get a long term solution to his alcohol and mental problem. He thinks his current medication regimen is working great without adverse reactions. Patient reported he has been staying with a friend's trailer in [place] and was stayed in [place] before coming to [facility]. 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   Assessment: During today's assessment, pt rates anxiety at 6/10 and depression at 6/10, stating that he is about the same vs. yesterday's assessment. Pt affirms compliance with medication and treatment regimen, including group therapy and is pleased with the progress of both of these. Pt denies HI, and AVH, contracts for safety. Pt also reports good sleep and appetite, stating that they have both improved with medication management. Pt does  affirm SI with intermittent thoughts today, but without plan.   Diagnosis:   DSM5: Schizophrenia Disorders:   Obsessive-Compulsive Disorders:   Trauma-Stressor Disorders:   Substance/Addictive Disorders:  Alcohol Related Disorder - Severe (303.90) Depressive Disorders:  Major Depressive Disorder - Severe (296.23) Total Time spent with patient: 25 minutes  Axis I: Major depressive disorder- recurrent episode severe  Alcohol dependence  Axis II: Deferred Axis III:  Past Medical History  Diagnosis Date  . Hypertension   . Degenerative disc disease 06/12/2013  . 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   Axis IV: other psychosocial or environmental problems and problems related to social environment  ADL's:  Intact  Sleep: Fair  Appetite:  Fair  Suicidal Ideation: yes   Affirms intermittent ideation throughout the day since waking but without plan Homicidal Ideation: Denies  AEB (as evidenced by):  Psychiatric Specialty Exam: Physical Exam  Psychiatric: His speech is normal. His mood appears anxious. He is slowed and withdrawn. Cognition and memory are normal. He expresses impulsivity. He exhibits a depressed mood. He expresses suicidal ideation.    Review of Systems  Constitutional: Negative.   HENT: Negative.   Eyes: Negative.   Respiratory: Negative.   Cardiovascular: Negative.   Gastrointestinal: Negative.   Genitourinary: Negative.   Musculoskeletal: Negative.   Skin: Negative.   Neurological: Positive for tremors.  Endo/Heme/Allergies: Negative.   Psychiatric/Behavioral: Positive for depression and suicidal ideas. Negative for substance abuse. The patient is nervous/anxious. The patient does not have insomnia.     Blood pressure 138/87, pulse 85, temperature 97.8 F (36.6 C), temperature source Oral, resp. rate 18,  height 5\' 7"  (1.702 m), weight 76.204 kg (168 lb).Body mass index is 26.31 kg/(m^2).  General Appearance: Disheveled  and poor grooming  Eye Contact::  Good  Speech:  Clear and Coherent  Volume:  Normal  Mood:  Depressed  Affect:  Constricted  Thought Process:  Goal Directed  Orientation:  Full (Time, Place, and Person)  Thought Content:  Negative  Suicidal Thoughts:  No  Homicidal Thoughts:  No  Memory:  Immediate;   Fair Recent;   Fair Remote;   Fair  Judgement:  Fair  Insight:  Shallow  Psychomotor Activity:  Decreased  Concentration:  Fair  Recall:  [employer] of Knowledge:Fair  Language: Good  Akathisia:  No  Handed:  Right  AIMS (if indicated):     Assets:  Communication Skills Desire for Improvement  Sleep:  Number of Hours: 6.75   Musculoskeletal: Strength & Muscle Tone: within normal limits Gait & Station: normal Patient leans: N/A  Current Medications: Current Facility-Administered Medications  Medication Dose Route Frequency Provider Last Rate Last Dose  . acetaminophen (TYLENOL) tablet 650 mg  650 mg Oral Q6H PRN Kristeen Mans, NP   650 mg at 06/17/13 0840  . alum & mag hydroxide-simeth (MAALOX/MYLANTA) 200-200-20 MG/5ML suspension 30 mL  30 mL Oral Q4H PRN Kristeen Mans, NP      . carvedilol (COREG) tablet 12.5 mg  12.5 mg Oral BID WC Nanine Means, NP   12.5 mg at 06/18/13 0850  . chlordiazePOXIDE (LIBRIUM) capsule 25 mg  25 mg Oral Once Nanine Means, NP      . feeding supplement (ENSURE COMPLETE) (ENSURE COMPLETE) liquid 237 mL  237 mL Oral BID BM Jeoffrey Massed, RD   237 mL at 06/18/13 0946  . magnesium hydroxide (MILK OF MAGNESIA) suspension 30 mL  30 mL Oral Daily PRN Kristeen Mans, NP      . multivitamin with minerals tablet 1 tablet  1 tablet Oral Daily Nanine Means, NP   1 tablet at 06/18/13 0851  . QUEtiapine (SEROQUEL XR) 24 hr tablet 100 mg  100 mg Oral Daily Nanine Means, NP   100 mg at 06/18/13 0851  . QUEtiapine (SEROQUEL XR) 24 hr tablet 300 mg  300 mg Oral QHS Nanine Means, NP   300 mg at 06/16/13 2249  . thiamine (B-1) injection 100 mg  100 mg Intramuscular  Once Nanine Means, NP      . thiamine (VITAMIN B-1) tablet 100 mg  100 mg Oral Daily Nanine Means, NP   100 mg at 06/18/13 1610    Lab Results:  No results found for this or any previous visit (from the past 48 hour(s)).  Physical Findings: AIMS: Facial and Oral Movements Muscles of Facial Expression: None, normal Lips and Perioral Area: None, normal Jaw: None, normal Tongue: None, normal,Extremity Movements Upper (arms, wrists, hands, fingers): None, normal Lower (legs, knees, ankles, toes): None, normal, Trunk Movements Neck, shoulders, hips: None, normal, Overall Severity Severity of abnormal movements (highest score from questions above): None, normal Incapacitation due to abnormal movements: None, normal Patient's awareness of abnormal movements (rate only patient's report): No Awareness, Dental Status Current problems with teeth and/or dentures?: No Does patient usually wear dentures?: No  CIWA:  CIWA-Ar Total: 1 COWS:     Treatment Plan Summary: Daily contact with patient to assess and evaluate symptoms and progress in treatment Medication management  Plan: 1. Admit for crisis management and stabilization. 2. Medication management to reduce current symptoms  to base line and improve the  patient's overall level of functioning:  Continue alcohol detox treatment and supportive therapy as planned Continue Seroquel 100mg  qam and 300mg  Qhs for mood stabilization. 3. Treat health problems as indicated. 4. Develop treatment plan to decrease risk of relapse upon discharge and the need for readmission. 5. Psycho-social education regarding relapse prevention and self care. 6. Health care follow up as needed for medical problems. 7. Restart home medications where appropriate. 8. Disposition plans are in progress and may be discharged on Friday if he continue to contract for safety and able to show clinical improvement  Medical Decision Making Problem Points:  Established problem,  worsening (2), Review of last therapy session (1) and Review of psycho-social stressors (1) Data Points:  Order Aims Assessment (2) Review of medication regiment & side effects (2) Review of new medications or change in dosage (2)  I certify that inpatient services furnished can reasonably be expected to improve the patient's condition.   Beau FannyWithrow, John C, FNP-BC 06/18/2013, 11:15 AM  Reviewed the information documented and agree with the treatment plan.  Lauralie Blacksher,JANARDHAHA R. 06/19/2013 8:53 AM

## 2013-06-18 NOTE — Progress Notes (Signed)
Patient ID: [name], male   DOB: 08/31/1950, 63 y.o.   MRN: [id number]  Morning Wellness Group 9:00 AM  The focus of this group is to educate the patient on the purpose and policies of crisis stabilization and provide a format to answer questions about their admission.  The group details unit policies and expectations of patients while admitted.  Patient attended group but did not participate in morning exercises. Patient was attentive in group but did not participate in conversation. Patient was quiet and had flat affect.

## 2013-06-18 NOTE — Progress Notes (Signed)
This morning patient was walking to breakfast stating, "No one care about what happens to me."  Patient states no on will find him a place to go when discharged and states he will be kicked out on the street.  Patient states, "I may as well sit on a park bench and freeze to death."  This was the first time during this shift that patient mentioned this.  Support and encouragement given at this time.  Patient walked down to breakfast after this.

## 2013-06-19 NOTE — Progress Notes (Signed)
Patient ID: Dylan Jacobson, male   DOB: 10/22/1950, 63 y.o.   MRN: [id number] Patient ID: Dylan Jacobson, male   DOB: 07/22/1950, 63 y.o.   MRN: [id number] [facility] Progress [facility]  06/19/2013 12:03 PM Dylan Jacobson  MRN:  [id number]  Subjective: "I am still anxious and depressed.'' HPI: : [facility] with long history of depression and comorbid alcohol dependence. Patient reports he was referred to the hospital by his catholic priest and he was taken bus ride to the hospital and seeking help for alcohol detox treatment and also depression with suicidal ideation. Patient reported he has been drinking since he was 63 years old and currently drinking 24 cans a day. Patient stated his depression as a 4/10 and anxiety 4/10 and feels better since being admitted and denies current suicidal or homicidal ideations and contracts for safety. Patient is hoping to be placed in a rehabilitation services upon completing detox treatment and treatment for depression. He reports feeling lonely, stressed out, hopeless, worthless and reportedly sleep is getting better. He is requesting for help to get a long term solution to his alcohol and mental problem. He thinks his current medication regimen is working great without adverse reactions. Patient reported he has been staying with a friend's trailer in [place] and was stayed in [place] before coming to [facility]. 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   Assessment: Patient has no place to go and feels more depressed when thinks about discharged to shelter or streets due to freezing cold and has no resources or people to support him. He rates anxiety at 6/10 and depression at 6/10. He is compliance with medication and treatment regimen, including group therapy and is pleased with the progress of both of these. He denies HI, and  AVH, contracts for safety. Pt also reports good sleep and appetite, stating that they have both improved with medication management.   Diagnosis:   DSM5: Schizophrenia Disorders:   Obsessive-Compulsive Disorders:   Trauma-Stressor Disorders:   Substance/Addictive Disorders:  Alcohol Related Disorder - Severe (303.90) Depressive Disorders:  Major Depressive Disorder - Severe (296.23) Total Time spent with patient: 25 minutes  Axis I: Major depressive disorder- recurrent episode severe  Alcohol dependence  Axis II: Deferred Axis III:  Past Medical History  Diagnosis Date  . Hypertension   . Degenerative disc disease 06/12/2013  . 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   Axis IV: other psychosocial or environmental problems and problems related to social environment  ADL's:  Intact  Sleep: Fair  Appetite:  Fair  Suicidal Ideation: yes   Affirms intermittent ideation throughout the day since waking but without plan Homicidal Ideation: Denies  AEB (as evidenced by):  Psychiatric Specialty Exam: Physical Exam  Psychiatric: His speech is normal. His mood appears anxious. He is slowed and withdrawn. Cognition and memory are normal. He expresses impulsivity. He exhibits a depressed mood. He expresses suicidal ideation.    Review of Systems  Constitutional: Negative.   HENT: Negative.   Eyes: Negative.   Respiratory: Negative.   Cardiovascular: Negative.   Gastrointestinal: Negative.   Genitourinary: Negative.   Musculoskeletal: Negative.   Skin: Negative.   Neurological: Positive for tremors.  Endo/Heme/Allergies: Negative.   Psychiatric/Behavioral: Positive for depression and suicidal ideas. Negative for substance abuse. The patient is nervous/anxious. The patient does not have  insomnia.     Blood pressure 120/79, pulse 93, temperature 97.9 F (36.6 C), temperature source Oral, resp. rate 16, height 5\' 7"  (1.702 m), weight 76.204 kg (168  lb).Body mass index is 26.31 kg/(m^2).  General Appearance: Disheveled and poor grooming  Eye Contact::  Good  Speech:  Clear and Coherent  Volume:  Normal  Mood:  Depressed  Affect:  Constricted  Thought Process:  Goal Directed  Orientation:  Full (Time, Place, and Person)  Thought Content:  Negative  Suicidal Thoughts:  No  Homicidal Thoughts:  No  Memory:  Immediate;   Fair Recent;   Fair Remote;   Fair  Judgement:  Fair  Insight:  Shallow  Psychomotor Activity:  Decreased  Concentration:  Fair  Recall:  [employer] of Knowledge:Fair  Language: Good  Akathisia:  No  Handed:  Right  AIMS (if indicated):     Assets:  Communication Skills Desire for Improvement  Sleep:  Number of Hours: 6.5   Musculoskeletal: Strength & Muscle Tone: within normal limits Gait & Station: normal Patient leans: N/A  Current Medications: Current Facility-Administered Medications  Medication Dose Route Frequency Provider Last Rate Last Dose  . acetaminophen (TYLENOL) tablet 650 mg  650 mg Oral Q6H PRN Kristeen Mans, NP   650 mg at 06/17/13 0840  . alum & mag hydroxide-simeth (MAALOX/MYLANTA) 200-200-20 MG/5ML suspension 30 mL  30 mL Oral Q4H PRN Kristeen Mans, NP      . carvedilol (COREG) tablet 12.5 mg  12.5 mg Oral BID WC Nanine Means, NP   12.5 mg at 06/19/13 1203  . chlordiazePOXIDE (LIBRIUM) capsule 25 mg  25 mg Oral Once Nanine Means, NP      . feeding supplement (ENSURE COMPLETE) (ENSURE COMPLETE) liquid 237 mL  237 mL Oral BID BM Jeoffrey Massed, RD   237 mL at 06/19/13 1203  . magnesium hydroxide (MILK OF MAGNESIA) suspension 30 mL  30 mL Oral Daily PRN Kristeen Mans, NP      . multivitamin with minerals tablet 1 tablet  1 tablet Oral Daily Nanine Means, NP   1 tablet at 06/18/13 0851  . QUEtiapine (SEROQUEL XR) 24 hr tablet 100 mg  100 mg Oral Daily Nanine Means, NP   100 mg at 06/19/13 1203  . QUEtiapine (SEROQUEL XR) 24 hr tablet 300 mg  300 mg Oral QHS Nanine Means, NP   300 mg at  06/16/13 2249  . thiamine (B-1) injection 100 mg  100 mg Intramuscular Once Nanine Means, NP      . thiamine (VITAMIN B-1) tablet 100 mg  100 mg Oral Daily Nanine Means, NP   100 mg at 06/19/13 1202    Lab Results:  No results found for this or any previous visit (from the past 48 hour(s)).  Physical Findings: AIMS: Facial and Oral Movements Muscles of Facial Expression: None, normal Lips and Perioral Area: None, normal Jaw: None, normal Tongue: None, normal,Extremity Movements Upper (arms, wrists, hands, fingers): None, normal Lower (legs, knees, ankles, toes): None, normal, Trunk Movements Neck, shoulders, hips: None, normal, Overall Severity Severity of abnormal movements (highest score from questions above): None, normal Incapacitation due to abnormal movements: None, normal Patient's awareness of abnormal movements (rate only patient's report): No Awareness, Dental Status Current problems with teeth and/or dentures?: No Does patient usually wear dentures?: No  CIWA:  CIWA-Ar Total: 1 COWS:     Treatment Plan Summary: Daily contact with patient to assess and evaluate symptoms and  progress in treatment Medication management  Plan: 1. Admit for crisis management and stabilization. 2. Medication management to reduce current symptoms to base line and improve the  patient's overall level of functioning:  Continue alcohol detox treatment and supportive therapy as planned Continue Seroquel 100mg  qam and 300mg  Qhs for mood stabilization. 3. Treat health problems as indicated. 4. Develop treatment plan to decrease risk of relapse upon discharge and the need for readmission. 5. Psycho-social education regarding relapse prevention and self care. 6. Health care follow up as needed for medical problems. 7. Restart home medications where appropriate. 8. Disposition plans are in progress and may be discharged on Friday if he continue to contract for safety and able to show clinical  improvement  Medical Decision Making Problem Points:  Established problem, worsening (2), Review of last therapy session (1) and Review of psycho-social stressors (1) Data Points:  Order Aims Assessment (2) Review of medication regiment & side effects (2) Review of new medications or change in dosage (2)  I certify that inpatient services furnished can reasonably be expected to improve the patient's condition.   Dawnielle Christiana,JANARDHAHA R. 06/19/2013 12:03 PM

## 2013-06-19 NOTE — Progress Notes (Signed)
D:  Patient has spent much of the day in bed.  Has attended a few groups, but his participation level is minimal.  States he wants to find a way back to [place] when he leaves.  He expresses concern that he will not be able to survive in a shelter in the cold.  Requested to have a [employer] Priest come to visit him.   A:  Medications given as prescribed.  Encouraged patient to be up and out of bed during the day.  Order placed to Spiritual Care Department for a priest to come and visit him.  Offered support and encouragement.  R:  Cooperative with staff.  Interacting well with peers in the group room, but minimal participation in groups.  Tolerating current medications.  Safety is maintained.

## 2013-06-19 NOTE — Progress Notes (Signed)
D: Pt  Presents with flat affect and depressed mood this evening. Pt isolated in his room all evening and refused to attend group. Per pt, he is trying to get back home to [place]. Pt stated that he have a trailer that he own's there. Pt compliant with taking evening meds. No complaints verbalized by pt. A: Medications administered as ordered per MD. Verbal support given. Pt encouraged to attend groups. 15 minute checks performed for safety. Pt safety maintained at this time.

## 2013-06-19 NOTE — Progress Notes (Signed)
[facility] Group Notes:  (Nursing/MHT/Case Management/Adjunct)  Date:  06/19/2013  Time:  11:35 PM  Type of Therapy:  Group Therapy  Participation Level:  Minimal  Participation Quality:  Appropriate  Affect:  Appropriate  Cognitive:  Appropriate  Insight:  Appropriate  Engagement in Group:  Engaged  Modes of Intervention:  Socialization and Support  Summary of Progress/Problems: Pt. Stated his plan to prevent relapse was avoiding alcohol.  Pt. Stated taking his medication has been helpful.  Pt. Expressed concerns for discharges and stated he plans to "walk to [place] Pt. Encouraged to speak with staff to develop plan and aid for discharge.  Sondra ComeWilson, Tyland Klemens J 06/19/2013, 11:35 PM

## 2013-06-19 NOTE — Progress Notes (Signed)
Recreation Therapy Notes  Date: 02.20.2015 Time: 2:45pm Location: [address] Dayroom   Group Topic: Communication, Team Building, Problem Solving  Goal Area(s) Addresses:  Patient will effectively work with peer towards shared goal.  Patient will identify skill used to make activity successful.  Patient will identify how skills used during activity can be used to reach post d/c goals.   Behavioral Response: Did not attend.   Aashvi Rezabek L Leul Narramore, LRT/CTRS  Ameliya Nicotra L 06/19/2013 4:21 PM 

## 2013-06-19 NOTE — [facility] Group Notes (Addendum)
[facility] L[facility] Discharge Planning Group Note   06/19/2013 11:05 AM    Participation Quality:  Appropraite  Mood/Affect:  Appropriate  Depression Rating:  7  Anxiety Rating:  6  Thoughts of Suicide:  No  Will you contract for safety?   NA  Current AVH:  No  Plan for Discharge/Comments:  Patient attended discharge planning group and actively participated in group.  Patient concerned about where he will live while awaiting a bed at [facility] 29, [facility].  CSW provided all participants with daily workbook.   Transportation Means: Patient has transportation.   Supports:  Patient has a support system.   Dylan Jacobson, Joesph JulyQuylle Hairston

## 2013-06-19 NOTE — Clinical Social Work Note (Signed)
Writer met with patient to advise him that he will not be discharged until Monday.  Patient stated he wants to leave today so that he can begin hitchhiking back to [place].  He stated if he should freeze to death tonight due to not getting a ride, God will not hold him responsible.

## 2013-06-19 NOTE — Progress Notes (Signed)
Patient ID: [name], male   DOB: 09/29/1950, 63 y.o.   MRN: [id number] D: pt. Lying in bed eyes closed, respirations even. A: writer observed for s/s of distress. Staff will monitor q2215min for safety. R: Pt. Is safe on the unit, no distress noted, respirations unlabored.

## 2013-06-19 NOTE — [facility] Group Notes (Signed)
[facility] LCSW Group Therapy  Feelings Around Relapse       06/19/2013 3:01 PM  Type of Therapy:  Group Therapy  Participation Level:  Did Not Attend  [name], Dylan Jacobson 06/19/2013, 3:01 PM

## 2013-06-20 DIAGNOSIS — F332 Major depressive disorder, recurrent severe without psychotic features: Principal | ICD-10-CM

## 2013-06-20 DIAGNOSIS — F1020 Alcohol dependence, uncomplicated: Secondary | ICD-10-CM

## 2013-06-20 NOTE — Progress Notes (Signed)
D: Pt mood is depressed but he states that it has improved significantly since admission. He states that he worries about not being able to afford to get a bus ticket to [place]. He constantly worries about where he will go when he is discharged.  A: Support given. Verbalization encouraged. Pt encouraged to come to staff with any concerns. Medications given as ordered. R: Pt is responsive. No complaints of pain or discomfort at this time. Q15 min safety checks maintained. Will continue to monitor pt.

## 2013-06-20 NOTE — Progress Notes (Addendum)
Patient ID: Dylan Jacobson, male   DOB: 06/20/1950, 63 y.o.   MRN: [id number] Patient ID: Zaheer Wageman, male   DOB: 1951/01/20, 63 y.o.   MRN: [id number] [facility] MD Progress Note  06/20/2013 3:21 PM Truong Delcastillo  MRN:  [id number]  Subjective: "I am still anxious and depressed.''Pateitn reports increased depression and anxiety related to his anticipated discharge.  He wants to get back to his home in [place] where he own a trailer.  He tells me he can get a train ticket for $61.00.  He doesn't get a check until; the 3rd, I passed this information on to the SW today. He rate his depression 9/10 and Anxiety 9/10. He reports to me today that he has not drank ETOH for five months and just got himself into a bad situation  "up here in [place] with some friends"   HPI: : Patient was [facility] the [facility] emergency department with long history of depression and comorbid alcohol dependence. Patient reports he was referred to the hospital by his catholic priest and he was taken bus ride to the hospital and seeking help for alcohol detox treatment and also depression with suicidal ideation. Patient reported he has been drinking since he was 63 years old and currently drinking 24 cans a day. Patient stated his depression as a 4/10 and anxiety 4/10 and feels better since being admitted and denies current suicidal or homicidal ideations and contracts for safety. Patient is hoping to be placed in a rehabilitation services upon completing detox treatment and treatment for depression. He reports feeling lonely, stressed out, hopeless, worthless and reportedly sleep is getting better. He is requesting for help to get a long term solution to his alcohol and mental problem. He thinks his current medication regimen is working great without adverse reactions. Patient reported he has been staying with a friend's trailer in [place] and was stayed in [place] before coming to [facility]. Patient reported  he has been working but Forensic psychologist regarding halfway house placement he wasn't [facility].   Assessment: Patient has no place to go and feels more depressed when thinks about discharged to shelter or streets due to freezing cold and has no resources or people to support him. He rates anxiety at 6/10 and depression at 6/10. He is compliance with medication and treatment regimen, including group therapy and is pleased with the progress of both of these. He denies HI, and AVH, contracts for safety. Pt also reports good sleep and appetite, stating that they have both improved with medication management.   Diagnosis:   DSM5: Schizophrenia Disorders:   Obsessive-Compulsive Disorders:   Trauma-Stressor Disorders:   Substance/Addictive Disorders:  Alcohol Related Disorder - Severe (303.90) Depressive Disorders:  Major Depressive Disorder - Severe (296.23) Total Time spent with patient: 25 minutes  Axis I: Major depressive disorder- recurrent episode severe  Alcohol dependence  Axis II: Deferred Axis III:  Past Medical History  Diagnosis Date  . Hypertension   . Degenerative disc disease 06/12/2013  . 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   Axis IV: other psychosocial or environmental problems and problems related to social environment  ADL's:  Intact  Sleep: Fair  Appetite:  Fair  Suicidal Ideation: yes   Affirms intermittent ideation throughout the day since waking but without plan Homicidal Ideation: Denies  AEB (as evidenced by):  Psychiatric Specialty Exam: Physical Exam  Psychiatric: His speech is normal. His mood  appears anxious. He is slowed and withdrawn. Cognition and memory are normal. He expresses impulsivity. He exhibits a depressed mood. He expresses suicidal ideation.    Review of Systems  Constitutional: Negative.   HENT: Negative.   Eyes: Negative.   Respiratory: Negative.   Cardiovascular: Negative.    Gastrointestinal: Negative.   Genitourinary: Negative.   Musculoskeletal: Negative.   Skin: Negative.   Neurological: Positive for tremors.  Endo/Heme/Allergies: Negative.   Psychiatric/Behavioral: Positive for depression and suicidal ideas. Negative for substance abuse. The patient is nervous/anxious. The patient does not have insomnia.     Blood pressure 107/73, pulse 98, temperature 97.5 F (36.4 C), temperature source Oral, resp. rate 20, height 5\' 7"  (1.702 m), weight 76.204 kg (168 lb).Body mass index is 26.31 kg/(m^2).  General Appearance: Disheveled and poor grooming  Eye Contact::  Good  Speech:  Clear and Coherent  Volume:  Normal  Mood:  Depressed  Affect:  Constricted  Thought Process:  Goal Directed  Orientation:  Full (Time, Place, and Person)  Thought Content:  Negative  Suicidal Thoughts:  No  Homicidal Thoughts:  No  Memory:  Immediate;   Fair Recent;   Fair Remote;   Fair  Judgement:  Fair  Insight:  Shallow  Psychomotor Activity:  Decreased  Concentration:  Fair  Recall:  [employer] of Knowledge:Fair  Language: Good  Akathisia:  No  Handed:  Right  AIMS (if indicated):     Assets:  Communication Skills Desire for Improvement  Sleep:  Number of Hours: 6.75   Musculoskeletal: Strength & Muscle Tone: within normal limits Gait & Station: normal Patient leans: N/A  Current Medications: Current Facility-Administered Medications  Medication Dose Route Frequency Provider Last Rate Last Dose  . acetaminophen (TYLENOL) tablet 650 mg  650 mg Oral Q6H PRN Kristeen Mans, NP   650 mg at 06/17/13 0840  . alum & mag hydroxide-simeth (MAALOX/MYLANTA) 200-200-20 MG/5ML suspension 30 mL  30 mL Oral Q4H PRN Kristeen Mans, NP      . carvedilol (COREG) tablet 12.5 mg  12.5 mg Oral BID WC Nanine Means, NP   12.5 mg at 06/20/13 0902  . chlordiazePOXIDE (LIBRIUM) capsule 25 mg  25 mg Oral Once Nanine Means, NP      . feeding supplement (ENSURE COMPLETE) (ENSURE COMPLETE)  liquid 237 mL  237 mL Oral BID BM Jeoffrey Massed, RD   237 mL at 06/20/13 1000  . magnesium hydroxide (MILK OF MAGNESIA) suspension 30 mL  30 mL Oral Daily PRN Kristeen Mans, NP      . multivitamin with minerals tablet 1 tablet  1 tablet Oral Daily Nanine Means, NP   1 tablet at 06/20/13 0902  . QUEtiapine (SEROQUEL XR) 24 hr tablet 100 mg  100 mg Oral Daily Nanine Means, NP   100 mg at 06/20/13 4098  . QUEtiapine (SEROQUEL XR) 24 hr tablet 300 mg  300 mg Oral QHS Nanine Means, NP   300 mg at 06/19/13 2151  . thiamine (B-1) injection 100 mg  100 mg Intramuscular Once Nanine Means, NP      . thiamine (VITAMIN B-1) tablet 100 mg  100 mg Oral Daily Nanine Means, NP   100 mg at 06/20/13 1191    Lab Results:  No results found for this or any previous visit (from the past 48 hour(s)).  Physical Findings: AIMS: Facial and Oral Movements Muscles of Facial Expression: None, normal Lips and Perioral Area: None, normal Jaw: None,  normal Tongue: None, normal,Extremity Movements Upper (arms, wrists, hands, fingers): None, normal Lower (legs, knees, ankles, toes): None, normal, Trunk Movements Neck, shoulders, hips: None, normal, Overall Severity Severity of abnormal movements (highest score from questions above): None, normal Incapacitation due to abnormal movements: None, normal Patient's awareness of abnormal movements (rate only patient's report): No Awareness, Dental Status Current problems with teeth and/or dentures?: No Does patient usually wear dentures?: No  CIWA:  CIWA-Ar Total: 1 COWS:     Treatment Plan Summary: Daily contact with patient to assess and evaluate symptoms and progress in treatment Medication management  Plan: 1. Admit for crisis management and stabilization. -discussed personal responsibility in healthcare and life choices 2. Medication management to reduce current symptoms to base line and improve the  patient's overall level of functioning:  Continue alcohol detox  treatment and supportive therapy as planned Continue Seroquel 100mg  qam and 300mg  Qhs for mood stabilization. 3. Treat health problems as indicated. 4. Develop treatment plan to decrease risk of relapse upon discharge and the need for readmission. 5. Psycho-social education regarding relapse prevention and self care. 6. Health care follow up as needed for medical problems. 7. Restart home medications where appropriate. 8. Disposition plans are in progress and may be discharged on Friday if he continue to contract for safety and able to show clinical improvement  Medical Decision Making Problem Points:  Established problem, worsening (2), Review of last therapy session (1) and Review of psycho-social stressors (1) Data Points:  Order Aims Assessment (2) Review of medication regiment & side effects (2) Review of new medications or change in dosage (2)  I certify that inpatient services furnished can reasonably be expected to improve the patient's condition.   Lorinda CreedLARACH, MARY  PMHNP 06/20/2013 3:21 PM Agree with assessment and plan Madie RenoIrving A. Dub MikesLugo, M.D.

## 2013-06-20 NOTE — Progress Notes (Signed)
Psychoeducational Group Note  Date: 06/20/2013 Time:  1015  Group Topic/Focus:  Identifying Needs:   The focus of this group is to help patients identify their personal needs that have been historically problematic and identify healthy behaviors to address their needs.  Participation Level:  Active  Participation Quality:  Appropriate  Affect:  Appropriate  Cognitive:  Oriented  Insight:  Improving  Engagement in Group:  Engaged  Additional Comments:  Attended and participated in the group  Qualyn Oyervides A  

## 2013-06-20 NOTE — [facility] Group Notes (Signed)
[facility] Group Notes: (Clinical Social Work)   06/20/2013      Type of Therapy:  Group Therapy   Participation Level:  Did Not Attend    Ambrose MantleMareida Grossman-Orr, LCSW 06/20/2013, 3:22 PM

## 2013-06-20 NOTE — Progress Notes (Signed)
Adult Psychoeducational Group Note  Date:  06/20/2013 Time:  9:20 PM  Group Topic/Focus:  Wrap-Up Group:   The focus of this group is to help patients review their daily goal of treatment and discuss progress on daily workbooks.  Participation Level:  Active  Participation Quality:  Appropriate  Affect:  Appropriate  Cognitive:  Appropriate  Insight: Appropriate  Engagement in Group:  Engaged  Modes of Intervention:  Discussion  Additional Comments: The  Patient expressed that he is leaving  Monday. The patient said he is feeling hoplessness about leaving.  Octavio Mannshigpen, Linde Wilensky Lee 06/20/2013, 9:20 PM

## 2013-06-20 NOTE — Progress Notes (Signed)
D Adren is seen OOB UAL on the 500 hall today. HE spends much of his time in his bed..sleeping. He takes his emds as ordered, as well as his ENsures ( he says he still likes the Ensures but that his appetite is getting "  better" day by day...   A Rainier completed his daily self inventory and on it he wrote he rated his depression and hopelessness are " 7/ 7 " and stated he has experienced SI within the past 24 hrs, and he said his DC plan will include " eating more ".    R This evening he asks this nurse if he can be dc'd tomorrow and he is encouraged to speak to MDabout this tomorrow. He says he has had SI within the past 24 hrs and says : I can't wait to get home".

## 2013-06-20 NOTE — Progress Notes (Signed)
.  Psychoeducational Group Note    Date: 06/20/2013 Time:  0930   Goal Setting Purpose of Group: To be able to set a goal that is measurable and that can be accomplished in one day Participation Level:  Active  Participation Quality:  Appropriate  Affect:  Appropriate  Cognitive:  Oriented  Insight:  Improving  Engagement in Group:  Improving  Additional Comments:    Dylan Jacobson A  

## 2013-06-21 NOTE — Progress Notes (Signed)
Psychoeducational Group Note  Date: 06/21/2013 Time:  0930  Group Topic/Focus:  Gratefulness:  The focus of this group is to help patients identify what two things they are most grateful for in their lives. What helps ground them and to center them on their work to their recovery.  Participation Level:  Active  Participation Quality:  Appropriate  Affect:  Appropriate  Cognitive:  Oriented  Insight:  Improving  Engagement in Group:  Engaged  Additional Comments:  Participated fully in the group  Dylan Jacobson   

## 2013-06-21 NOTE — Progress Notes (Signed)
Dylan Jacobson is out and about on the 500 hall, tolerated fair. He is quiet, keeps to himslef and says " I'm just waiting to go home tomorrow".   A He sleeps in between his groups. When he does attend his groups, he listens but does not tal;k. He denies SI within the past 24 hrs and answersd the self inventory questions when asked them by the staff.   R Safety is in place and hopefully pt can be DC'Dylan tomorrow.

## 2013-06-21 NOTE — Progress Notes (Signed)
Patient ID: Dylan Jacobson, male   DOB: 1950/06/29, 63 y.o.   MRN: [id number] Patient ID: Dylan Jacobson, male   DOB: 1950/07/13, 63 y.o.   MRN: [id number] [facility] MD Progress Note  06/21/2013 3:21 PM Dylan Jacobson  MRN:  [id number]  Subjective:  On assessment patient's main concern is transportation home.  He is compliance with medication and treatment regimen, including group therapy and is pleased with the progress of both of these. He denies HI, and AVH, contracts for safety. Pt also reports good sleep and appetite, stating that they have both improved with medication management.    "I am still anxious and depressed.''Pateitn reports increased depression and anxiety related to his anticipated discharge.  He wants to get back to his home in [place] where he own a trailer.  He tells me he can get a train ticket for $61.00.  He doesn't get a check until; the 3rd, I passed this information on to the SW today. He rate his depression 5/10 and Anxiety 8/10.  HPI: : Patient was admitted from the [facility] emergency department with long history of depression and comorbid alcohol dependence. Patient reports he was referred to the hospital by his catholic priest and he was taken bus ride to the hospital and seeking help for alcohol detox treatment and also depression with suicidal ideation. Patient reported he has been drinking since he was 63 years old and currently drinking 24 cans a day. Patient stated his depression as a 4/10 and anxiety 4/10 and feels better since being admitted and denies current suicidal or homicidal ideations and contracts for safety. Patient is hoping to be placed in a rehabilitation services upon completing detox treatment and treatment for depression. He reports feeling lonely, stressed out, hopeless, worthless and reportedly sleep is getting better. He is requesting for help to get a long term solution to his alcohol and mental problem. He thinks his current medication regimen is  working great without adverse reactions. Patient reported he has been staying with a friend's trailer in [place] and was stayed in [place] before coming to [facility]. 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    Diagnosis:   DSM5: Substance/Addictive Disorders:  Alcohol Related Disorder - Severe (303.90) Depressive Disorders:  Major Depressive Disorder - Severe (296.23) Total Time spent with patient: 25 minutes  Axis I: Major depressive disorder- recurrent episode severe  Alcohol dependence  Axis II: Deferred Axis III:  Past Medical History  Diagnosis Date  . Hypertension   . Degenerative disc disease 06/12/2013  . 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   Axis IV: other psychosocial or environmental problems and problems related to social environment  ADL's:  Intact  Sleep: Good  Appetite:  Good  Suicidal Ideation: yes   -denies presently, contract for safety Homicidal Ideation: Denies  AEB (as evidenced by):  Psychiatric Specialty Exam: Physical Exam  Constitutional: He appears well-developed and well-nourished.  HENT:  Head: Normocephalic and atraumatic.  Neck: Normal range of motion. Neck supple.  Respiratory: Effort normal.  Musculoskeletal: Normal range of motion.  Neurological: He is alert.  Skin: Skin is warm and dry.  Psychiatric: His speech is normal. His mood appears anxious. He is slowed and withdrawn. Cognition and memory are normal. He expresses impulsivity. He exhibits a depressed mood. He expresses suicidal ideation.    Review of Systems  Constitutional: Negative.  HENT: Negative.   Eyes: Negative.   Respiratory: Negative.   Cardiovascular: Negative.   Gastrointestinal: Negative.   Genitourinary: Negative.   Musculoskeletal: Negative.   Skin: Negative.   Neurological: Positive for tremors.  Endo/Heme/Allergies:  Negative.   Psychiatric/Behavioral: Positive for depression and suicidal ideas. Negative for substance abuse. The patient is nervous/anxious. The patient does not have insomnia.     Blood pressure 125/86, pulse 92, temperature 97.9 F (36.6 C), temperature source Oral, resp. rate 16, height 5\' 7"  (1.702 m), weight 76.204 kg (168 lb).Body mass index is 26.31 kg/(m^2).  General Appearance: Disheveled and poor grooming  Eye Contact::  Good  Speech:  Clear and Coherent  Volume:  Normal  Mood:  Depressed  Affect:  Constricted  Thought Process:  Goal Directed  Orientation:  Full (Time, Place, and Person)  Thought Content:  Negative  Suicidal Thoughts:  No  Homicidal Thoughts:  No  Memory:  Immediate;   Fair Recent;   Fair Remote;   Fair  Judgement:  Fair  Insight:  Shallow  Psychomotor Activity:  Decreased  Concentration:  Fair  Recall:  [employer]  Fund of Knowledge:Fair  Language: Good  Akathisia:  No  Handed:  Right  AIMS (if indicated):     Assets:  Communication Skills Desire for Improvement  Sleep:  Number of Hours: 6.75   Musculoskeletal: Strength & Muscle Tone: within normal limits Gait & Station: normal Patient leans: N/A  Current Medications: Current Facility-Administered Medications  Medication Dose Route Frequency Provider Last Rate Last Dose  . acetaminophen (TYLENOL) tablet 650 mg  650 mg Oral Q6H PRN Kristeen MansFran E Hobson, NP   650 mg at 06/17/13 0840  . alum & mag hydroxide-simeth (MAALOX/MYLANTA) 200-200-20 MG/5ML suspension 30 mL  30 mL Oral Q4H PRN Kristeen MansFran E Hobson, NP      . carvedilol (COREG) tablet 12.5 mg  12.5 mg Oral BID WC Nanine MeansJamison Lord, NP   12.5 mg at 06/21/13 0843  . chlordiazePOXIDE (LIBRIUM) capsule 25 mg  25 mg Oral Once Nanine MeansJamison Lord, NP      . feeding supplement (ENSURE COMPLETE) (ENSURE COMPLETE) liquid 237 mL  237 mL Oral BID BM Jeoffrey MassedLaura Lee Jobe, RD   237 mL at 06/21/13 0844  . magnesium hydroxide (MILK OF MAGNESIA) suspension 30 mL  30 mL Oral Daily PRN Kristeen MansFran E  Hobson, NP      . multivitamin with minerals tablet 1 tablet  1 tablet Oral Daily Nanine MeansJamison Lord, NP   1 tablet at 06/21/13 [phone]  . QUEtiapine (SEROQUEL XR) 24 hr tablet 100 mg  100 mg Oral Daily Nanine MeansJamison Lord, NP   100 mg at 06/21/13 0843  . QUEtiapine (SEROQUEL XR) 24 hr tablet 300 mg  300 mg Oral QHS Nanine MeansJamison Lord, NP   300 mg at 06/20/13 2148  . thiamine (B-1) injection 100 mg  100 mg Intramuscular Once Nanine MeansJamison Lord, NP      . thiamine (VITAMIN B-1) tablet 100 mg  100 mg Oral Daily Nanine MeansJamison Lord, NP   100 mg at 06/21/13 [id number]    Lab Results:  No results found for this or any previous visit (from the past 48 hour(s)).  Physical Findings: AIMS: Facial and Oral Movements Muscles of Facial Expression: None, normal Lips and Perioral Area: None, normal Jaw: None, normal Tongue: None, normal,Extremity Movements Upper (arms, wrists, hands, fingers): None, normal Lower (legs, knees, ankles, toes): None, normal, Trunk Movements Neck, shoulders, hips: None, normal, Overall Severity Severity of abnormal movements (highest score from  questions above): None, normal Incapacitation due to abnormal movements: None, normal Patient's awareness of abnormal movements (rate only patient's report): No Awareness, Dental Status Current problems with teeth and/or dentures?: No Does patient usually wear dentures?: No  CIWA:  CIWA-Ar Total: 0 COWS:     Treatment Plan Summary: Daily contact with patient to assess and evaluate symptoms and progress in treatment Medication management  Plan: 1. Admit for crisis management and stabilization. -discussed personal responsibility in healthcare and life choices 2. Medication management to reduce current symptoms to base line and improve the  patient's overall level of functioning:  Continue alcohol detox treatment and supportive therapy as planned Continue Seroquel 100mg  qam and 300mg  Qhs for mood stabilization. 3. Treat health problems as indicated. 4. Develop  treatment plan to decrease risk of relapse upon discharge and the need for readmission. 5. Psycho-social education regarding relapse prevention and self care. 6. Health care follow up as needed for medical problems. 7. Restart home medications where appropriate. 8. Disposition plans are in progress and may be discharged on Friday if he continue to contract for safety and able to show clinical improvement  Medical Decision Making Problem Points:  Established problem, worsening (2), Review of last therapy session (1) and Review of psycho-social stressors (1) Data Points:  Order Aims Assessment (2) Review of medication regiment & side effects (2) Review of new medications or change in dosage (2)  I certify that inpatient services furnished can reasonably be expected to improve the patient's condition.   Lorinda Creed  PMHNP 06/21/2013 3:21 PM Agree with assessment and plan Madie Reno A. Dub Mikes, M.D.

## 2013-06-21 NOTE — [facility] Group Notes (Signed)
[facility] Group Notes:  (Clinical Social Work)  06/21/2013   1:15-2:15PM  Summary of Progress/Problems:  The main focus of today's process group was to   identify the patient's current support system and decide on other supports that can be put in place.  The picture on workbook was used to discuss why additional supports are needed.  An emphasis was placed on using counselor, doctor, therapy groups, 12-step groups, and problem-specific support groups to expand supports.   There was also an extensive discussion about what constitutes a healthy support versus an unhealthy support.  The patient nodded comprehension of the concepts presented, and agreement that there is a need to add more supports.    Type of Therapy:  Process Group  Participation Level:  Active  Participation Quality:  Attentive   Affect:   Flat and Depressed  Cognitive:  Appropriate and Oriented  Insight:  Engaged  Engagement in Therapy:  Engaged  Modes of Intervention:  Education,  Support and [employer] FoodsProcessing  Eretria Manternach Grossman-Orr, LCSW 06/21/2013, 4:00pm

## 2013-06-21 NOTE — Progress Notes (Signed)
Adult Psychoeducational Group Note  Date:  06/21/2013 Time:  3:05PM  Group Topic/Focus:  Making Healthy Choices:   The focus of this group is to help patients identify negative/unhealthy choices they were using prior to admission and identify positive/healthier coping strategies to replace them upon discharge.  Participation Level:  Active  Participation Quality:  Appropriate  Affect:  Appropriate  Cognitive:  Appropriate  Insight: Appropriate  Engagement in Group:  Engaged  Modes of Intervention:  Discussion  Additional Comments:  Pt was attentive throughout group   Madonna Flegal K 06/21/2013, 3:18 PM 

## 2013-06-21 NOTE — Progress Notes (Signed)
Pt has been resting in bed all evening, audible snoring. Awoke to admin sched seroquel which pt took without difficulty. Brief assess completed and pt denies pain, problems, SI/HI/AVH. Pt safe. Lawrence MarseillesFriedman, Sidi Dzikowski Eakes

## 2013-06-21 NOTE — Progress Notes (Signed)
Adult Psychoeducational Group Note  Date:  06/21/2013 Time:  11:11 PM  Group Topic/Focus:  Wrap-Up Group:   The focus of this group is to help patients review their daily goal of treatment and discuss progress on daily workbooks.  Participation Level:  Did Not Attend  Participation Quality:  did not attend  Affect:  did not attend  Cognitive:  did not attend  Insight: None  Engagement in Group:  None  Modes of Intervention:  did not attend  Additional Comments:  Did not attend  Jola Baptistbdin, Kym Fenter 06/21/2013, 11:11 PM

## 2013-06-21 NOTE — Progress Notes (Signed)
Psychoeducational Group Note  Date:  06/21/2013 Time:  1015  Group Topic/Focus:  Making Healthy Choices:   The focus of this group is to help patients identify negative/unhealthy choices they were using prior to admission and identify positive/healthier coping strategies to replace them upon discharge.  Participation Level:  Active  Participation Quality:  Appropriate  Affect:  appropriate Cognitive:  Oriented  Insight:  Improving  Engagement in Group:  Engaged  Additional Comments:   Geniene List A 06/21/2013  

## 2013-06-22 MED ORDER — CARVEDILOL 12.5 MG PO TABS: 12.5000 mg | ORAL_TABLET | Freq: Two times a day (BID) | ORAL | Status: AC

## 2013-06-22 MED ORDER — QUETIAPINE FUMARATE ER 50 MG PO TB24: 100.0000 mg | ORAL_TABLET | Freq: Every day | ORAL | Status: AC

## 2013-06-22 MED ORDER — QUETIAPINE FUMARATE ER 300 MG PO TB24: 300.0000 mg | ORAL_TABLET | Freq: Every day | ORAL | Status: AC

## 2013-06-22 NOTE — Progress Notes (Signed)
Patient ID: Dylan MooreMitchel Auth, male   DOB: 09/05/1950, 63 y.o.   MRN: [id number] Patient was discharged ambulatory to [facility] bus [facility].  He denies SI/HI. He verbalizes understanding of his discharge meds and followup. He was given a bus pass, a supply of meds and scripts.  Patient cannot read He was able to teach back his meds and followup.  He knows to ask someone at the shelter to red him his discharge instructions if he forgets.  Meds and followup circled in red and patient aware of this.  Patient knows that shelter made exception to accomodate him and he was eager to leave so he could be thee before 1600.

## 2013-06-22 NOTE — Tx Team (Signed)
Interdisciplinary Treatment Plan Update   Date Reviewed:  06/22/2013  Time Reviewed:  9:54 AM  Progress in Treatment:   Attending groups: Yes Participating in groups: Yes Taking medication as prescribed: Yes  Tolerating medication: Yes Family/Significant other contact made:  No, patient advised of not having any collateral contacts. Patient understands diagnosis: Yes  Discussing patient identified problems/goals with staff: Yes Medical problems stabilized or resolved: Yes Denies suicidal/homicidal ideation: Yes Patient has not harmed self or others: Yes  For review of initial/current patient goals, please see plan of care.  Estimated Length of Stay:  Discharge today  Reasons for Continued Hospitalization:    New Problems/Goals identified:    Discharge Plan or Barriers:   Home [facility].  Patient to return to the shelter.  Additional Comments:    Attendees:  Patient:  06/22/2013 9:54 AM   Signature: Mervyn GayJ. Jonnalagadda, MD 06/22/2013 9:54 AM  Signature:   06/22/2013 9:54 AM  Signature:  Claudette Headonrad Withrow, NP 06/22/2013 9:54 AM  Signature:   Neill Loftarol Davis 06/22/2013 9:54 AM  Signature:  Quintella ReichertBeverly Knight, RN  06/22/2013 9:54 AM  Signature:  Juline PatchQuylle Manpreet Strey, LCSW 06/22/2013 9:54 AM  Signature:  Reyes Ivanhelsea Horton, LCSW 06/22/2013 9:54 AM  Signature:  06/22/2013 9:54 AM  Signature:  Ronnie Dossngela Throne, RN 06/22/2013 9:54 AM  Signature:  06/22/2013  9:54 AM  Signature:   Onnie BoerJennifer Clark, RN [facility] 06/22/2013  9:54 AM  Signature:  06/22/2013  9:54 AM    Scribe for Treatment Team:   Juline PatchQuylle Jaheem Hedgepath,  06/22/2013 9:54 AM

## 2013-06-22 NOTE — [facility] Suicide Risk Assessment (Signed)
   Demographic Factors:  Male, Adolescent or young adult, Caucasian, Low socioeconomic status, Living alone and Unemployed  Total Time spent with patient: 30 minutes  Psychiatric Specialty Exam: Physical Exam  Constitutional: He is oriented to person, place, and time. He appears well-developed.  HENT:  Head: Normocephalic.  Eyes: Pupils are equal, round, and reactive to light.  Neck: Normal range of motion.  Cardiovascular: Normal rate.   Respiratory: Effort normal.  GI: Soft.  Musculoskeletal: Normal range of motion.  Neurological: He is alert and oriented to person, place, and time.  Skin: Skin is warm.    Review of Systems  All other systems reviewed and are negative.    Blood pressure 128/85, pulse 83, temperature 97.3 F (36.3 C), temperature source Oral, resp. rate 20, height 5\' 7"  (1.702 m), weight 76.204 kg (168 lb).Body mass index is 26.31 kg/(m^2).  General Appearance: Casual  Eye Contact::  Good  Speech:  Clear and Coherent  Volume:  Normal  Mood:  Anxious and Depressed  Affect:  Constricted and Depressed  Thought Process:  Goal Directed and Intact  Orientation:  Full (Time, Place, and Person)  Thought Content:  WDL  Suicidal Thoughts:  No  Homicidal Thoughts:  No  Memory:  Immediate;   Good  Judgement:  Intact  Insight:  Fair  Psychomotor Activity:  Normal  Concentration:  Good  Recall:  Fair  Fund of Knowledge:Good  Language: Good  Akathisia:  NA  Handed:  Right  AIMS (if indicated):     Assets:  Communication Skills Desire for Improvement Leisure Time Physical Health Resilience Social Support  Sleep:  Number of Hours: 6.75    Musculoskeletal: Strength & Muscle Tone: within normal limits Gait & Station: normal Patient leans: N/A   Mental Status Per Nursing Assessment::   On Admission:     Current Mental Status by Physician: NA  Loss Factors: Financial problems/change in socioeconomic status  Historical Factors: Family history of  mental illness or substance abuse and Impulsivity  Risk Reduction Factors:   Sense of responsibility to family, Religious beliefs about death, Positive social support, Positive therapeutic relationship and Positive coping skills or problem solving skills  Continued Clinical Symptoms:  Depression:   Comorbid alcohol abuse/dependence Recent sense of peace/wellbeing Alcohol/Substance Abuse/Dependencies Previous Psychiatric Diagnoses and Treatments Medical Diagnoses and Treatments/Surgeries  Cognitive Features That Contribute To Risk:  Polarized thinking    Suicide Risk:  Minimal: No identifiable suicidal ideation.  Patients presenting with no risk factors but with morbid ruminations; may be classified as minimal risk based on the severity of the depressive symptoms  Discharge Diagnoses:   AXIS I:  Major Depression, Recurrent severe, Substance Induced Mood Disorder and Alcohol dependence AXIS II:  Deferred AXIS III:   Past Medical History  Diagnosis Date  . Hypertension   . Alcohol abuse   . Degenerative disc disease 06/12/2013  . 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   AXIS IV:  economic problems, housing problems, other psychosocial or environmental problems, problems related to social environment and problems with primary support group AXIS V:  61-70 mild symptoms  Plan Of Care/Follow-up recommendations:  Activity:  As tolerated Diet:  Regular  Is patient on multiple antipsychotic therapies at discharge:  No   Has Patient had three or more failed trials of antipsychotic monotherapy by history:  No  Recommended Plan for Multiple Antipsychotic Therapies: NA   Madelyn Tlatelpa,JANARDHAHA R. 06/22/2013, 12:52 PM

## 2013-06-22 NOTE — Progress Notes (Signed)
D/C instructions/meds/follow-up appointments reviewed, pt verbalized understanding, pt's belongings returned to pt, samples given. 

## 2013-06-24 NOTE — Progress Notes (Signed)
Patient Discharge Instructions:  After Visit Summary (AVS):   Faxed to:  06/24/13 Discharge Summary Note:   Faxed to:  06/24/13 Psychiatric Admission Assessment Note:   Faxed to:  06/24/13 Suicide Risk Assessment - Discharge Assessment:   Faxed to:  06/24/13 Faxed/Sent to the Next Level Care provider:  06/24/13 Faxed to [facility] @ [phone] Faxed to [facility] @ [phone]  Jerelene ReddenSheena E [place], 06/24/2013, 3:43 PM

## 2013-06-24 NOTE — Discharge Summary (Signed)
Physician Discharge Summary Note  Patient:  Dylan Jacobson is an 63 y.o., male MRN:  [id number] DOB:  12/23/1950 Patient phone:  [phone] (home)  Patient address:   [address] [place] [address],  Total Time spent with patient: Greater than 30 minutes  Date of Admission:  06/12/2013 Date of Discharge: 06/22/2013  Reason for Admission:  MDD with SI, with plan, and Alcohol detox  Discharge Diagnoses: Principal Problem:   Alcohol dependence Active Problems:   MDD (major depressive disorder), recurrent severe, without psychosis   Psychiatric Specialty Exam: Physical Exam  Review of Systems  Constitutional: Negative.   HENT: Negative.   Eyes: Negative.   Respiratory: Negative.   Cardiovascular: Negative.   Gastrointestinal: Negative.   Genitourinary: Negative.   Musculoskeletal: Negative.   Skin: Negative.   Neurological: Negative.   Endo/Heme/Allergies: Negative.   Psychiatric/Behavioral: Positive for depression and substance abuse. Negative for suicidal ideas and hallucinations. The patient is nervous/anxious.     Blood pressure 128/85, pulse 83, temperature 97.3 F (36.3 C), temperature source Oral, resp. rate 20, height 5\' 7"  (1.702 m), weight 76.204 kg (168 lb).Body mass index is 26.31 kg/(m^2).  General Appearance: Casual  Eye Contact::  Good  Speech:  Clear and Coherent  Volume:  Normal  Mood:  Anxious and Depressed  Affect:  Appropriate  Thought Process:  Coherent  Orientation:  Full (Time, Place, and Person)  Thought Content:  WDL  Suicidal Thoughts:  No  Homicidal Thoughts:  No  Memory:  Immediate;   Fair Recent;   Fair Remote;   Fair  Judgement:  Good  Insight:  Good  Psychomotor Activity:  Normal  Concentration:  Good  Recall:  Good  Fund of Knowledge:Good  Language: Good  Akathisia:  NA  Handed:  Right  AIMS (if indicated):     Assets:  Communication Skills Desire for Improvement Resilience  Sleep:  Number of Hours: 6.75      Musculoskeletal: Strength & Muscle Tone: within normal limits Gait & Station: normal Patient leans: N/A  DSM5:  Substance/Addictive Disorders:  Alcohol Related Disorder - Severe (303.90) Depressive Disorders:  Major Depressive Disorder - Severe (296.23)  Axis Diagnosis:   AXIS I:  Alcohol Abuse and Major Depression, Recurrent severe AXIS II:  Deferred AXIS III:   Past Medical History  Diagnosis Date  . Hypertension   . Alcohol abuse   . Degenerative disc disease 06/12/2013  . 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   AXIS IV:  other psychosocial or environmental problems and problems related to social environment AXIS V:  51-60 moderate symptoms  Level of Care:  RTC ([facility], transported there in AM)  Hospital Course:   63 y.o. widowed white male. [facility], reportedly at the recommendation of a Catholic priest to whom he spoke earlier today. He problems with alcohol, along with SI. Stressors: Pt reports that about 1 month ago he relapsed on alcohol. Pt states, "I almost took my pills today" (Seroquel, hoarded). Came to the ED to the intervention of the priest and pt's concern about afterlife. However, pt reports that he has overdosed on 3 occasions in the past, twice by overdose, and most recently by a combination of overdosing and cutting his arms bilaterally, deeply enough to require sutures. This attempt was 8 months ago. He denies any other history of self mutilation. He also reports that he has not eaten in 4 - 5 days, and has lost about  20# in the past month. Pt denies homicidal thoughts or physical aggression. Denies AVH. He relapsed about 1 month ago, and was drinking about 18 beers daily before stopping 2 days ago. Pt's wife perished about 14 years ago. Alcohol abuse has contributed to pt's alienation from people that are dear to him and to financial problems, and in the past he faced a public intoxication charge. Pt  reports that he has no social supports at this time, being estranged from his brother, even though he cites the brother as his emergency contact. He may be homeless at this time. He continues to grieve the death of his mother about 1 year ago. 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 He identifies as Air traffic controller. Pt reports that he has been admitted to 28 day programs on three occasions, one in [place], and two in [place], [place], including his most recent admission about 8 months ago. He reports that he has never entered into any outpatient treatment of any kind, other than AA meetings. His prescription Seroquel is from the last 28 day program. Today pt is willing to volunteer for admission to [facility] if it is believed to be in his interest. Afterwards, he would like to enter a halfway house, since he has never tried this before as a means of maintaining sobriety.   During Hospitalization: Medications managed, psychoeducation, group and individual therapy. Pt currently denies SI, HI, and Psychosis. At discharge, pt rates anxiety at 6/10 and depression at 6/10. Pt states that he does not have a good supportive home environment and will followup with inpatient treatment for alcohol detox at [facility]. Affirms agreement with medication regimen and discharge plan. Denies other physical and psychological concerns at time of discharge.   Consults:  None  Significant Diagnostic Studies:  None  Discharge Vitals:   Blood pressure 128/85, pulse 83, temperature 97.3 F (36.3 C), temperature source Oral, resp. rate 20, height 5\' 7"  (1.702 m), weight 76.204 kg (168 lb). Body mass index is 26.31 kg/(m^2). Lab Results:   No results found for this or any previous visit (from the past 72 hour(s)).  Physical Findings: AIMS: Facial and Oral Movements Muscles of Facial Expression: None, normal Lips and Perioral Area: None, normal Jaw: None,  normal Tongue: None, normal,Extremity Movements Upper (arms, wrists, hands, fingers): None, normal Lower (legs, knees, ankles, toes): None, normal, Trunk Movements Neck, shoulders, hips: None, normal, Overall Severity Severity of abnormal movements (highest score from questions above): None, normal Incapacitation due to abnormal movements: None, normal Patient's awareness of abnormal movements (rate only patient's report): No Awareness, Dental Status Current problems with teeth and/or dentures?: No Does patient usually wear dentures?: No  CIWA:  CIWA-Ar Total: 0 COWS:     Psychiatric Specialty Exam: See Psychiatric Specialty Exam and Suicide Risk Assessment completed by Attending Physician prior to discharge.  Discharge destination:  [facility]  Is patient on multiple antipsychotic therapies at discharge:  No   Has Patient had three or more failed trials of antipsychotic monotherapy by history:  No  Recommended Plan for Multiple Antipsychotic Therapies: NA     Medication List    STOP taking these medications       QUEtiapine 100 MG tablet  Commonly known as:  SEROQUEL  Replaced by:  QUEtiapine 50 MG Tb24 24 hr tablet     QUEtiapine 300 MG tablet  Commonly known as:  SEROQUEL  Replaced by:  QUEtiapine 300 MG 24 hr  tablet      TAKE these medications     Indication   carvedilol 12.5 MG tablet  Commonly known as:  COREG  Take 1 tablet (12.5 mg total) by mouth 2 (two) times daily with a meal.   Indication:  High Blood Pressure of Unknown Cause     QUEtiapine 50 MG Tb24 24 hr tablet  Commonly known as:  SEROQUEL XR  Take 2 tablets (100 mg total) by mouth daily.   Indication:  mood stabilization     QUEtiapine 300 MG 24 hr tablet  Commonly known as:  SEROQUEL XR  Take 1 tablet (300 mg total) by mouth at bedtime.   Indication:  mood stabilization           Follow-up Information   Follow up with [facility] On 06/22/2013. (Please go to [facility]'s walk in clinic  on Monday, June 22, 2013 or any weekday between 8AM - 3PM for medication management )    Contact information:   [address] [place], [place]   [address]  [phone]      Follow up with [facility] On 06/29/2013. (You are scheduled with [facility] on Monday, June 29, 2013 at 8:00 AM for an admission assessment)    Contact information:   [address] [place], [place]   [address]   [phone]      Follow-up recommendations:  Activity:  As tolerated Diet:  Heart healthy with low sodium  Comments:   Take all medications as prescribed. Keep all follow-up appointments as scheduled.  Do not consume alcohol or use illegal drugs while on prescription medications. Report any adverse effects from your medications to your primary care provider promptly.  In the event of recurrent symptoms or worsening symptoms, call 911, a crisis hotline, or go to the nearest emergency department for evaluation.   Total Discharge Time:  Greater than 30 minutes.  Signed: Beau Fanny, FNP-BC 06/24/2013, 2:10 PM  Patient was seen face to face for psych evaluation, suicide risk assessment and case discussed with physician extender and made discharge planning. Reviewed the information documented and agree with the treatment plan.   Ramelo Oetken,JANARDHAHA R. 06/25/2013 12:26 PM

## 2014-07-15 ENCOUNTER — Encounter (HOSPITAL_COMMUNITY): Payer: Self-pay | Admitting: Emergency Medicine

## 2014-07-15 ENCOUNTER — Emergency Department (INDEPENDENT_AMBULATORY_CARE_PROVIDER_SITE_OTHER)
Admission: EM | Admit: 2014-07-15 | Discharge: 2014-07-15 | Disposition: A | Discharge status: Home / Self Care | Payer: Medicare (Managed Care) | Source: Home / Self Care | Attending: Family Medicine | Admitting: Family Medicine

## 2014-07-15 DIAGNOSIS — G8929 Other chronic pain: Secondary | ICD-10-CM

## 2014-07-15 DIAGNOSIS — I10 Essential (primary) hypertension: Secondary | ICD-10-CM

## 2014-07-15 DIAGNOSIS — M549 Dorsalgia, unspecified: Secondary | ICD-10-CM | POA: Diagnosis not present

## 2014-07-15 LAB — POCT I-STAT, CHEM 8
BUN: 17 mg/dL (ref 6–23)
CREATININE: 1.2 mg/dL (ref 0.50–1.35)
Calcium, Ion: 1.18 mmol/L (ref 1.13–1.30)
Chloride: 102 mmol/L (ref 96–112)
Glucose, Bld: 107 mg/dL — ABNORMAL HIGH (ref 70–99)
HEMATOCRIT: 43 % (ref 39.0–52.0)
Hemoglobin: 14.6 g/dL (ref 13.0–17.0)
Potassium: 3.9 mmol/L (ref 3.5–5.1)
SODIUM: 142 mmol/L (ref 135–145)
TCO2: 26 mmol/L (ref 0–100)

## 2014-07-15 MED ORDER — METHOCARBAMOL 750 MG PO TABS: 750.0000 mg | ORAL_TABLET | Freq: Two times a day (BID) | ORAL | Status: AC

## 2014-07-15 MED ORDER — TRAMADOL HCL 50 MG PO TABS
50.0000 mg | ORAL_TABLET | Freq: Three times a day (TID) | ORAL | Status: AC | PRN
Start: 2014-07-15 — End: ?

## 2014-07-15 MED ORDER — AMLODIPINE BESYLATE 10 MG PO TABS
10.0000 mg | ORAL_TABLET | Freq: Every day | ORAL | Status: AC
Start: 2014-07-15 — End: ?

## 2014-07-15 MED ORDER — SODIUM CHLORIDE 0.9 % IV BOLUS (SEPSIS): 1000.0000 mL | Freq: Once | INTRAVENOUS | Status: DC

## 2014-07-15 NOTE — Discharge Instructions (Signed)
Thank you for coming in today. 1) Chronic pain: Continue tramadol and robaxin. STOP ibuprofen. This medicine is hurting your kidneys. Try using tylenol instead.  Follow up with a primary doctor.  2) High Blood pressure. Start Amlodipine.   We will not refill pain medicines again.   PRIMARY CARE Merchant navy officerDOCTORS [facility] [address]  [place], [place] Ph [phone]  Fax [phone]  [facility] conservation [facility] at [facility] [address]  [place], [place] Ph [phone]  Fax [phone]  [facility] conservation [facility] at [place] [phone]  [place], [place] Ph [phone]  Fax [phone]  [facility] [address]  [place], [place] Ph [phone]  Fax [phone]  [facility] [address] [place] [address]  [place], [place] Ph [phone]  Fax [phone]  [facility] at [facility] [address] [place]  [place], [place] Ph [phone]  Fax [phone]   [facility] [address] [place] [address] Phone: [phone]   [facility] @ [facility] [address] [place] [address] Phone: [phone]   [facility] @ [place] [address] [place] [address] Phone: [phone]   [facility] @ [facility] [address] [place] [address] Phone: [phone]   [facility] @ [place] [address] [place] [address] Phone: [phone] Fax: [phone]   [facility] @ [place] [address] [place] [address] Phone: [phone]   Dr. Maryelizabeth RowanElizabeth Dewey [address] [place] [address] [phone]  Come back or go to the emergency room if you notice new weakness new numbness problems walking or bowel or bladder  problems.

## 2014-07-15 NOTE — ED Provider Notes (Signed)
Dylan Jacobson is a 63 y.o. male who presents to Urgent Care today for medication refill. Patient has chronic thoracic and cervical back pain present for years. He recently moved to the area from [place] has not yet established with a primary care provider. He takes tramadol ibuprofen and Robaxin for pain daily.  He denies any new pain radiating pain weakness or numbness. He notes previous history of hypertension but is not currently taking any medication. Previously he was taking lisinopril.   Past Medical History  Diagnosis Date  . Hypertension   . Alcohol abuse   . Degenerative disc disease 06/12/2013  . 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   Past Surgical History  Procedure Laterality Date  . Back surgery  06/12/2013    Pt reports Hx of 3 back surgeries   History  Substance Use Topics  . Smoking status: Current Every Day Smoker -- 0.50 packs/day    Types: Cigarettes  . Smokeless tobacco: Never Used  . Alcohol Use: Yes     Comment: pt states he binge drinks   ROS as above Medications: Current Facility-Administered Medications  Medication Dose Route Frequency Provider Last Rate Last Dose  . sodium chloride 0.9 % bolus 1,000 mL  1,000 mL Intravenous Once Rodolph Bong, MD       Current Outpatient Prescriptions  Medication Sig Dispense Refill  . amLODipine (NORVASC) 10 MG tablet Take 1 tablet (10 mg total) by mouth daily. 30 tablet 0  . carvedilol (COREG) 12.5 MG tablet Take 1 tablet (12.5 mg total) by mouth 2 (two) times daily with a meal. 60 tablet 0  . methocarbamol (ROBAXIN) 750 MG tablet Take 1 tablet (750 mg total) by mouth 2 (two) times daily. 30 tablet 0  . QUEtiapine (SEROQUEL XR) 300 MG 24 hr tablet Take 1 tablet (300 mg total) by mouth at bedtime. 30 tablet 0  . QUEtiapine (SEROQUEL XR) 50 MG TB24 24 hr tablet Take 2 tablets (100 mg total) by mouth daily. 60 tablet 0  . traMADol (ULTRAM) 50 MG tablet Take 1 tablet (50 mg total) by mouth  every 8 (eight) hours as needed. 30 tablet 0   No Known Allergies   Exam:  BP 156/87 mmHg  Pulse 82  Temp(Src) 98.4 F (36.9 C) (Oral)  Resp 18  SpO2 99% Gen: Well NAD HEENT: EOMI,  MMM Lungs: Normal work of breathing. CTABL Heart: RRR no MRG Abd: NABS, Soft. Nondistended, Nontender Exts: Brisk capillary refill, warm and well perfused.  Back: Nontender to spinal midline. Tender palpation right cervical and thoracic paraspinal. Decreased cervical range of motion rotation only due to pain. Negative Spurling's test. Upper extremity strength is equal and normal throughout. Reflexes are equal and normal bilaterally. No gait.  Left hand Dupuytren contracture fifth digit   Results for orders placed or performed during the hospital encounter of 07/15/14 (from the past 24 hour(s))  I-STAT, chem 8     Status: Abnormal   Collection Time: 07/15/14 10:28 AM  Result Value Ref Range   Sodium 142 135 - 145 mmol/L   Potassium 3.9 3.5 - 5.1 mmol/L   Chloride 102 96 - 112 mmol/L   BUN 17 6 - 23 mg/dL   Creatinine, Ser 5.28 0.50 - 1.35 mg/dL   Glucose, Bld 413 (H) 70 - 99 mg/dL   Calcium, Ion 2.44 0.10 - 1.30 mmol/L   TCO2 26 0 - 100 mmol/L   Hemoglobin 14.6 13.0 - 17.0 g/dL  HCT 43.0 39.0 - 52.0 %   No results found.  Assessment and Plan: 64 y.o. male with  1) Chronic pain: F/u with a new PCP ASAP. 10 days tramadol and robaxin. D/C lisinopril.  2) HTN: STOP Ibuprofen. Start amlodipine. F/u with PCP.  Discussed warning signs or symptoms. Please see discharge instructions. Patient expresses understanding.     Rodolph BongEvan S Corey, MD 07/15/14 1126

## 2014-07-15 NOTE — ED Notes (Signed)
Pt is here needing refills on meds Just moved from [place] 2 weeks ago  Alert, no signs of acute distress.
